# Patient Record
Sex: Male | Born: 1940 | Race: Black or African American | Hispanic: No | Marital: Single | Smoking: Current every day smoker
Health system: Southern US, Community
[De-identification: ages and names within clinical notes are randomized; demographics above are authoritative.]

## PROBLEM LIST (undated history)

## (undated) DIAGNOSIS — K219 Gastro-esophageal reflux disease without esophagitis: Secondary | ICD-10-CM

## (undated) DIAGNOSIS — Z8619 Personal history of other infectious and parasitic diseases: Secondary | ICD-10-CM

## (undated) DIAGNOSIS — R972 Elevated prostate specific antigen [PSA]: Secondary | ICD-10-CM

## (undated) DIAGNOSIS — I10 Essential (primary) hypertension: Secondary | ICD-10-CM

## (undated) DIAGNOSIS — F4321 Adjustment disorder with depressed mood: Secondary | ICD-10-CM

## (undated) DIAGNOSIS — C679 Malignant neoplasm of bladder, unspecified: Secondary | ICD-10-CM

## (undated) DIAGNOSIS — E119 Type 2 diabetes mellitus without complications: Secondary | ICD-10-CM

## (undated) DIAGNOSIS — F329 Major depressive disorder, single episode, unspecified: Secondary | ICD-10-CM

## (undated) DIAGNOSIS — F17200 Nicotine dependence, unspecified, uncomplicated: Secondary | ICD-10-CM

## (undated) DIAGNOSIS — E785 Hyperlipidemia, unspecified: Secondary | ICD-10-CM

## (undated) DIAGNOSIS — M549 Dorsalgia, unspecified: Secondary | ICD-10-CM

## (undated) HISTORY — DX: Hyperlipidemia, unspecified: E78.5

## (undated) HISTORY — DX: Dorsalgia, unspecified: M54.9

## (undated) HISTORY — DX: Malignant neoplasm of bladder, unspecified: C67.9

## (undated) HISTORY — DX: Major depressive disorder, single episode, unspecified: F32.9

## (undated) HISTORY — DX: Essential (primary) hypertension: I10

## (undated) HISTORY — DX: Adjustment disorder with depressed mood: F43.21

## (undated) HISTORY — DX: Personal history of other infectious and parasitic diseases: Z86.19

## (undated) HISTORY — DX: Gastro-esophageal reflux disease without esophagitis: K21.9

## (undated) HISTORY — DX: Nicotine dependence, unspecified, uncomplicated: F17.200

## (undated) HISTORY — DX: Elevated prostate specific antigen (PSA): R97.20

## (undated) HISTORY — PX: OTHER SURGICAL HISTORY: SHX169

## (undated) HISTORY — DX: Type 2 diabetes mellitus without complications: E11.9

---

## 1998-09-15 ENCOUNTER — Encounter: Admission: RE | Admit: 1998-09-15 | Discharge: 1998-09-15 | Payer: Self-pay | Admitting: *Deleted

## 1999-06-08 ENCOUNTER — Encounter: Payer: Self-pay | Admitting: Otolaryngology

## 1999-06-08 ENCOUNTER — Ambulatory Visit (HOSPITAL_COMMUNITY): Admission: RE | Admit: 1999-06-08 | Discharge: 1999-06-08 | Payer: Self-pay | Admitting: Otolaryngology

## 1999-06-11 ENCOUNTER — Ambulatory Visit (HOSPITAL_BASED_OUTPATIENT_CLINIC_OR_DEPARTMENT_OTHER): Admission: RE | Admit: 1999-06-11 | Discharge: 1999-06-11 | Payer: Self-pay | Admitting: Otolaryngology

## 2001-06-05 ENCOUNTER — Ambulatory Visit (HOSPITAL_COMMUNITY): Admission: RE | Admit: 2001-06-05 | Discharge: 2001-06-05 | Payer: Self-pay | Admitting: Internal Medicine

## 2001-06-05 ENCOUNTER — Encounter: Payer: Self-pay | Admitting: Internal Medicine

## 2004-05-10 ENCOUNTER — Ambulatory Visit: Admission: RE | Admit: 2004-05-10 | Discharge: 2004-05-10 | Payer: Self-pay | Admitting: Internal Medicine

## 2004-07-09 ENCOUNTER — Ambulatory Visit: Payer: Self-pay | Admitting: Internal Medicine

## 2004-07-24 ENCOUNTER — Ambulatory Visit: Payer: Self-pay | Admitting: Internal Medicine

## 2004-08-10 ENCOUNTER — Ambulatory Visit: Payer: Self-pay | Admitting: Internal Medicine

## 2004-08-13 ENCOUNTER — Ambulatory Visit: Payer: Self-pay | Admitting: Internal Medicine

## 2004-08-16 ENCOUNTER — Ambulatory Visit: Payer: Self-pay | Admitting: Internal Medicine

## 2004-08-21 ENCOUNTER — Ambulatory Visit: Payer: Self-pay | Admitting: Internal Medicine

## 2004-08-29 ENCOUNTER — Ambulatory Visit: Payer: Self-pay | Admitting: Family Medicine

## 2004-09-05 ENCOUNTER — Ambulatory Visit: Payer: Self-pay | Admitting: Internal Medicine

## 2004-09-07 ENCOUNTER — Ambulatory Visit: Payer: Self-pay | Admitting: Internal Medicine

## 2004-09-12 ENCOUNTER — Ambulatory Visit: Payer: Self-pay | Admitting: Internal Medicine

## 2004-09-26 ENCOUNTER — Ambulatory Visit: Payer: Self-pay | Admitting: Internal Medicine

## 2004-10-18 ENCOUNTER — Ambulatory Visit: Payer: Self-pay | Admitting: Internal Medicine

## 2004-11-15 ENCOUNTER — Ambulatory Visit: Payer: Self-pay | Admitting: Internal Medicine

## 2005-03-01 ENCOUNTER — Ambulatory Visit: Payer: Self-pay | Admitting: Internal Medicine

## 2005-03-13 ENCOUNTER — Ambulatory Visit: Payer: Self-pay | Admitting: Internal Medicine

## 2005-03-19 ENCOUNTER — Ambulatory Visit: Payer: Self-pay | Admitting: Internal Medicine

## 2005-04-30 ENCOUNTER — Ambulatory Visit: Payer: Self-pay | Admitting: Internal Medicine

## 2005-05-01 ENCOUNTER — Inpatient Hospital Stay (HOSPITAL_COMMUNITY): Admission: EM | Admit: 2005-05-01 | Discharge: 2005-05-04 | Payer: Self-pay | Admitting: Emergency Medicine

## 2005-05-01 ENCOUNTER — Ambulatory Visit: Payer: Self-pay | Admitting: Gastroenterology

## 2005-05-01 ENCOUNTER — Encounter (INDEPENDENT_AMBULATORY_CARE_PROVIDER_SITE_OTHER): Payer: Self-pay | Admitting: Specialist

## 2005-05-02 ENCOUNTER — Ambulatory Visit: Payer: Self-pay | Admitting: Gastroenterology

## 2005-05-08 ENCOUNTER — Ambulatory Visit: Payer: Self-pay | Admitting: Internal Medicine

## 2005-05-18 ENCOUNTER — Observation Stay (HOSPITAL_COMMUNITY): Admission: EM | Admit: 2005-05-18 | Discharge: 2005-05-19 | Payer: Self-pay | Admitting: Emergency Medicine

## 2005-05-29 ENCOUNTER — Ambulatory Visit: Payer: Self-pay | Admitting: Gastroenterology

## 2005-06-05 ENCOUNTER — Encounter (INDEPENDENT_AMBULATORY_CARE_PROVIDER_SITE_OTHER): Payer: Self-pay | Admitting: Specialist

## 2005-06-05 ENCOUNTER — Ambulatory Visit: Payer: Self-pay | Admitting: Gastroenterology

## 2005-06-10 ENCOUNTER — Ambulatory Visit: Payer: Self-pay | Admitting: Internal Medicine

## 2005-07-04 ENCOUNTER — Ambulatory Visit: Payer: Self-pay | Admitting: Gastroenterology

## 2005-08-19 ENCOUNTER — Ambulatory Visit: Payer: Self-pay | Admitting: Internal Medicine

## 2005-09-16 ENCOUNTER — Ambulatory Visit: Payer: Self-pay | Admitting: Internal Medicine

## 2005-12-16 ENCOUNTER — Ambulatory Visit: Payer: Self-pay | Admitting: Internal Medicine

## 2006-01-01 ENCOUNTER — Ambulatory Visit: Payer: Self-pay | Admitting: Internal Medicine

## 2006-03-17 ENCOUNTER — Ambulatory Visit: Payer: Self-pay | Admitting: Internal Medicine

## 2006-06-03 ENCOUNTER — Ambulatory Visit: Payer: Self-pay | Admitting: Internal Medicine

## 2006-07-14 ENCOUNTER — Ambulatory Visit: Payer: Self-pay | Admitting: Internal Medicine

## 2006-08-28 ENCOUNTER — Ambulatory Visit: Payer: Self-pay | Admitting: Internal Medicine

## 2006-10-13 ENCOUNTER — Ambulatory Visit: Payer: Self-pay | Admitting: Internal Medicine

## 2006-10-13 LAB — CONVERTED CEMR LAB
Basophils Absolute: 0 10*3/uL (ref 0.0–0.1)
Basophils Relative: 0.6 % (ref 0.0–1.0)
Eosinophils Absolute: 0.9 10*3/uL — ABNORMAL HIGH (ref 0.0–0.6)
Eosinophils Relative: 12.9 % — ABNORMAL HIGH (ref 0.0–5.0)
HCT: 43.8 % (ref 39.0–52.0)
Hemoglobin: 15.1 g/dL (ref 13.0–17.0)
Iron: 82 ug/dL (ref 42–165)
Lymphocytes Relative: 34.8 % (ref 12.0–46.0)
MCHC: 35.2 g/dL (ref 30.0–36.0)
MCV: 97.8 fL (ref 78.0–100.0)
Monocytes Absolute: 0.7 10*3/uL (ref 0.2–0.7)
Monocytes Relative: 9.4 % (ref 3.0–11.0)
Neutro Abs: 3.2 10*3/uL (ref 1.4–7.7)
Neutrophils Relative %: 42.3 % — ABNORMAL LOW (ref 43.0–77.0)
Platelets: 164 10*3/uL (ref 150–400)
RBC: 4.48 M/uL (ref 4.22–5.81)
RDW: 14.4 % (ref 11.5–14.6)
Saturation Ratios: 30.3 % (ref 20.0–50.0)
Transferrin: 193.2 mg/dL — ABNORMAL LOW (ref >=212.0)
WBC: 7.3 10*3/uL (ref 4.5–10.5)

## 2007-01-27 ENCOUNTER — Emergency Department (HOSPITAL_COMMUNITY): Admission: EM | Admit: 2007-01-27 | Discharge: 2007-01-27 | Payer: Self-pay | Admitting: Emergency Medicine

## 2007-02-03 ENCOUNTER — Ambulatory Visit: Payer: Self-pay | Admitting: Internal Medicine

## 2007-02-03 LAB — CONVERTED CEMR LAB
BUN: 17 mg/dL (ref 6–23)
CO2: 27 meq/L (ref 19–32)
Calcium: 9.2 mg/dL (ref 8.4–10.5)
Chloride: 107 meq/L (ref 96–112)
Creatinine, Ser: 1.1 mg/dL (ref 0.4–1.5)
GFR calc Af Amer: 86 mL/min
GFR calc non Af Amer: 71 mL/min
Glucose, Bld: 98 mg/dL (ref 70–99)
Hgb A1c MFr Bld: 6.6 % — ABNORMAL HIGH (ref 4.6–6.0)
PSA: 0.15 ng/mL (ref 0.10–4.00)
Potassium: 4.3 meq/L (ref 3.5–5.1)
Sodium: 142 meq/L (ref 135–145)

## 2007-02-16 ENCOUNTER — Observation Stay (HOSPITAL_COMMUNITY): Admission: AD | Admit: 2007-02-16 | Discharge: 2007-02-17 | Payer: Self-pay | Admitting: Urology

## 2007-02-16 ENCOUNTER — Encounter: Payer: Self-pay | Admitting: Urology

## 2007-02-16 ENCOUNTER — Encounter: Payer: Self-pay | Admitting: Internal Medicine

## 2007-03-03 ENCOUNTER — Encounter: Payer: Self-pay | Admitting: Internal Medicine

## 2007-03-05 ENCOUNTER — Ambulatory Visit: Payer: Self-pay | Admitting: Internal Medicine

## 2007-03-23 ENCOUNTER — Ambulatory Visit (HOSPITAL_COMMUNITY): Admission: AD | Admit: 2007-03-23 | Discharge: 2007-03-24 | Payer: Self-pay | Admitting: Urology

## 2007-03-23 ENCOUNTER — Encounter: Payer: Self-pay | Admitting: Urology

## 2007-03-23 ENCOUNTER — Other Ambulatory Visit: Payer: Self-pay | Admitting: Urology

## 2007-04-07 ENCOUNTER — Encounter: Payer: Self-pay | Admitting: Internal Medicine

## 2007-06-01 DIAGNOSIS — I10 Essential (primary) hypertension: Secondary | ICD-10-CM | POA: Insufficient documentation

## 2007-06-01 DIAGNOSIS — Z8619 Personal history of other infectious and parasitic diseases: Secondary | ICD-10-CM

## 2007-06-01 DIAGNOSIS — E119 Type 2 diabetes mellitus without complications: Secondary | ICD-10-CM

## 2007-06-01 HISTORY — DX: Type 2 diabetes mellitus without complications: E11.9

## 2007-06-01 HISTORY — DX: Personal history of other infectious and parasitic diseases: Z86.19

## 2007-06-01 HISTORY — DX: Essential (primary) hypertension: I10

## 2007-06-25 ENCOUNTER — Ambulatory Visit: Payer: Self-pay | Admitting: Internal Medicine

## 2007-06-25 DIAGNOSIS — F4321 Adjustment disorder with depressed mood: Secondary | ICD-10-CM

## 2007-06-25 HISTORY — DX: Adjustment disorder with depressed mood: F43.21

## 2007-08-21 ENCOUNTER — Ambulatory Visit: Payer: Self-pay | Admitting: Internal Medicine

## 2007-08-21 LAB — CONVERTED CEMR LAB
BUN: 13 mg/dL (ref 6–23)
CO2: 29 meq/L (ref 19–32)
Calcium: 9.5 mg/dL (ref 8.4–10.5)
Chloride: 104 meq/L (ref 96–112)
Creatinine, Ser: 1.1 mg/dL (ref 0.4–1.5)
GFR calc Af Amer: 86 mL/min
GFR calc non Af Amer: 71 mL/min
Glucose, Bld: 180 mg/dL — ABNORMAL HIGH (ref 70–99)
Hgb A1c MFr Bld: 6.1 % — ABNORMAL HIGH (ref 4.6–6.0)
Potassium: 4.1 meq/L (ref 3.5–5.1)
Sodium: 140 meq/L (ref 135–145)

## 2007-09-15 ENCOUNTER — Encounter: Payer: Self-pay | Admitting: Internal Medicine

## 2007-10-05 ENCOUNTER — Encounter: Payer: Self-pay | Admitting: Urology

## 2007-10-05 ENCOUNTER — Ambulatory Visit (HOSPITAL_BASED_OUTPATIENT_CLINIC_OR_DEPARTMENT_OTHER): Admission: RE | Admit: 2007-10-05 | Discharge: 2007-10-05 | Payer: Self-pay | Admitting: Urology

## 2007-10-13 ENCOUNTER — Encounter: Payer: Self-pay | Admitting: Internal Medicine

## 2007-10-16 ENCOUNTER — Telehealth (INDEPENDENT_AMBULATORY_CARE_PROVIDER_SITE_OTHER): Payer: Self-pay | Admitting: *Deleted

## 2007-11-24 ENCOUNTER — Ambulatory Visit: Payer: Self-pay | Admitting: Internal Medicine

## 2007-11-24 DIAGNOSIS — F329 Major depressive disorder, single episode, unspecified: Secondary | ICD-10-CM

## 2007-11-24 DIAGNOSIS — M549 Dorsalgia, unspecified: Secondary | ICD-10-CM

## 2007-11-24 DIAGNOSIS — F3289 Other specified depressive episodes: Secondary | ICD-10-CM

## 2007-11-24 HISTORY — DX: Major depressive disorder, single episode, unspecified: F32.9

## 2007-11-24 HISTORY — DX: Other specified depressive episodes: F32.89

## 2007-11-24 HISTORY — DX: Dorsalgia, unspecified: M54.9

## 2007-11-24 LAB — CONVERTED CEMR LAB
BUN: 12 mg/dL (ref 6–23)
CO2: 29 meq/L (ref 19–32)
Calcium: 9.8 mg/dL (ref 8.4–10.5)
Chloride: 104 meq/L (ref 96–112)
Creatinine, Ser: 1 mg/dL (ref 0.4–1.5)
Creatinine,U: 116.3 mg/dL
GFR calc Af Amer: 96 mL/min
GFR calc non Af Amer: 79 mL/min
Glucose, Bld: 117 mg/dL — ABNORMAL HIGH (ref 70–99)
Hgb A1c MFr Bld: 7.3 % — ABNORMAL HIGH (ref 4.6–6.0)
Microalb Creat Ratio: 37 mg/g — ABNORMAL HIGH (ref 0.0–30.0)
Microalb, Ur: 4.3 mg/dL — ABNORMAL HIGH (ref 0.0–1.9)
Potassium: 4.8 meq/L (ref 3.5–5.1)
Sodium: 139 meq/L (ref 135–145)

## 2008-02-24 ENCOUNTER — Ambulatory Visit: Payer: Self-pay | Admitting: Internal Medicine

## 2008-04-20 ENCOUNTER — Ambulatory Visit: Payer: Self-pay | Admitting: Internal Medicine

## 2008-04-20 DIAGNOSIS — K219 Gastro-esophageal reflux disease without esophagitis: Secondary | ICD-10-CM

## 2008-04-20 HISTORY — DX: Gastro-esophageal reflux disease without esophagitis: K21.9

## 2008-06-28 ENCOUNTER — Encounter: Payer: Self-pay | Admitting: Internal Medicine

## 2008-07-22 ENCOUNTER — Ambulatory Visit: Payer: Self-pay | Admitting: Internal Medicine

## 2008-10-14 ENCOUNTER — Ambulatory Visit: Payer: Self-pay | Admitting: Internal Medicine

## 2008-10-14 LAB — CONVERTED CEMR LAB
BUN: 7 mg/dL (ref 6–23)
CO2: 29 meq/L (ref 19–32)
Calcium: 9.7 mg/dL (ref 8.4–10.5)
Chloride: 105 meq/L (ref 96–112)
Cholesterol: 173 mg/dL (ref 0–200)
Creatinine, Ser: 1 mg/dL (ref 0.4–1.5)
GFR calc Af Amer: 96 mL/min
GFR calc non Af Amer: 79 mL/min
Glucose, Bld: 122 mg/dL — ABNORMAL HIGH (ref 70–99)
HDL: 40.4 mg/dL (ref >39.0)
Hgb A1c MFr Bld: 6.2 % — ABNORMAL HIGH (ref 4.6–6.0)
LDL Cholesterol: 116 mg/dL — ABNORMAL HIGH (ref 0–99)
Potassium: 4.6 meq/L (ref 3.5–5.1)
Sodium: 140 meq/L (ref 135–145)
Total CHOL/HDL Ratio: 4.3
Triglycerides: 82 mg/dL (ref 0–149)
VLDL: 16 mg/dL (ref 0–40)

## 2008-10-21 ENCOUNTER — Ambulatory Visit: Payer: Self-pay | Admitting: Internal Medicine

## 2008-12-23 ENCOUNTER — Ambulatory Visit: Payer: Self-pay | Admitting: Internal Medicine

## 2008-12-23 DIAGNOSIS — F17200 Nicotine dependence, unspecified, uncomplicated: Secondary | ICD-10-CM

## 2008-12-23 HISTORY — DX: Nicotine dependence, unspecified, uncomplicated: F17.200

## 2009-03-20 ENCOUNTER — Ambulatory Visit: Payer: Self-pay | Admitting: Internal Medicine

## 2009-03-20 DIAGNOSIS — E785 Hyperlipidemia, unspecified: Secondary | ICD-10-CM

## 2009-03-20 DIAGNOSIS — R972 Elevated prostate specific antigen [PSA]: Secondary | ICD-10-CM

## 2009-03-20 DIAGNOSIS — C679 Malignant neoplasm of bladder, unspecified: Secondary | ICD-10-CM | POA: Insufficient documentation

## 2009-03-20 HISTORY — DX: Elevated prostate specific antigen (PSA): R97.20

## 2009-03-20 HISTORY — DX: Malignant neoplasm of bladder, unspecified: C67.9

## 2009-03-20 HISTORY — DX: Hyperlipidemia, unspecified: E78.5

## 2009-03-20 LAB — CONVERTED CEMR LAB
ALT: 15 units/L (ref 0–53)
AST: 18 units/L (ref 0–37)
Albumin: 3.9 g/dL (ref 3.5–5.2)
Alkaline Phosphatase: 77 units/L (ref 39–117)
BUN: 16 mg/dL (ref 6–23)
Basophils Relative: 0.8 % (ref 0.0–3.0)
Bilirubin, Direct: 0 mg/dL (ref 0.0–0.3)
CO2: 30 meq/L (ref 19–32)
Calcium: 9.7 mg/dL (ref 8.4–10.5)
Chloride: 108 meq/L (ref 96–112)
Cholesterol: 164 mg/dL (ref 0–200)
Creatinine, Ser: 1 mg/dL (ref 0.4–1.5)
Creatinine,U: 162.8 mg/dL
Direct LDL: 101.2 mg/dL
Eosinophils Relative: 11.4 % — ABNORMAL HIGH (ref 0.0–5.0)
GFR calc non Af Amer: 95.59 mL/min (ref >60)
Glucose, Bld: 120 mg/dL — ABNORMAL HIGH (ref 70–99)
HCT: 41.6 % (ref 39.0–52.0)
HDL: 44.8 mg/dL (ref >39.00)
Hemoglobin: 15 g/dL (ref 13.0–17.0)
Hgb A1c MFr Bld: 6.5 % (ref 4.6–6.5)
Lymphocytes Relative: 23.7 % (ref 12.0–46.0)
MCHC: 35.9 g/dL (ref 30.0–36.0)
MCV: 94.5 fL (ref 78.0–100.0)
Microalb Creat Ratio: 20.3 mg/g (ref 0.0–30.0)
Microalb, Ur: 3.3 mg/dL — ABNORMAL HIGH (ref 0.0–1.9)
Monocytes Relative: 6.5 % (ref 3.0–12.0)
Neutrophils Relative %: 57.6 % (ref 43.0–77.0)
PSA: 0.14 ng/mL (ref 0.10–4.00)
Platelets: 142 10*3/uL — ABNORMAL LOW (ref 150.0–400.0)
Potassium: 4.4 meq/L (ref 3.5–5.1)
RBC: 4.4 M/uL (ref 4.22–5.81)
RDW: 14.2 % (ref 11.5–14.6)
Sodium: 143 meq/L (ref 135–145)
TSH: 0.3 microintl units/mL — ABNORMAL LOW (ref 0.35–5.50)
Total Bilirubin: 0.8 mg/dL (ref 0.3–1.2)
Total Protein: 7.5 g/dL (ref 6.0–8.3)
WBC: 6.9 10*3/uL (ref 4.5–10.5)

## 2009-03-20 LAB — HM DIABETES FOOT EXAM

## 2009-06-19 ENCOUNTER — Ambulatory Visit: Payer: Self-pay | Admitting: Internal Medicine

## 2009-06-19 LAB — CONVERTED CEMR LAB
BUN: 16 mg/dL (ref 6–23)
CO2: 30 meq/L (ref 19–32)
Chloride: 105 meq/L (ref 96–112)
Creatinine, Ser: 1 mg/dL (ref 0.4–1.5)
Hgb A1c MFr Bld: 6.5 % (ref 4.6–6.5)
Potassium: 4.4 meq/L (ref 3.5–5.1)

## 2009-09-21 ENCOUNTER — Telehealth: Payer: Self-pay | Admitting: Internal Medicine

## 2009-12-18 ENCOUNTER — Ambulatory Visit: Payer: Self-pay | Admitting: Internal Medicine

## 2009-12-18 LAB — CONVERTED CEMR LAB
ALT: 16 units/L (ref 0–53)
AST: 17 units/L (ref 0–37)
Albumin: 4 g/dL (ref 3.5–5.2)
Calcium: 9.6 mg/dL (ref 8.4–10.5)
Eosinophils Relative: 13.4 % — ABNORMAL HIGH (ref 0.0–5.0)
GFR calc non Af Amer: 95.37 mL/min (ref >60)
HCT: 43.1 % (ref 39.0–52.0)
Hemoglobin: 14.9 g/dL (ref 13.0–17.0)
Hgb A1c MFr Bld: 6.3 % (ref 4.6–6.5)
Lymphs Abs: 2 10*3/uL (ref 0.7–4.0)
MCV: 97.3 fL (ref 78.0–100.0)
Monocytes Absolute: 0.6 10*3/uL (ref 0.1–1.0)
Monocytes Relative: 7.4 % (ref 3.0–12.0)
Neutro Abs: 4 10*3/uL (ref 1.4–7.7)
Platelets: 205 10*3/uL (ref 150.0–400.0)
Potassium: 4.2 meq/L (ref 3.5–5.1)
Sodium: 141 meq/L (ref 135–145)
Total Bilirubin: 0.5 mg/dL (ref 0.3–1.2)
Total Protein: 7.6 g/dL (ref 6.0–8.3)
WBC: 7.6 10*3/uL (ref 4.5–10.5)

## 2010-03-20 ENCOUNTER — Ambulatory Visit: Payer: Self-pay | Admitting: Internal Medicine

## 2010-09-11 ENCOUNTER — Ambulatory Visit: Admit: 2010-09-11 | Payer: Self-pay | Admitting: Internal Medicine

## 2010-09-21 ENCOUNTER — Ambulatory Visit: Admit: 2010-09-21 | Payer: Self-pay | Admitting: Internal Medicine

## 2010-10-04 NOTE — Assessment & Plan Note (Signed)
Summary: Follow up/ns/cb   Vital Signs:  Patient profile:   70 year old male Height:      68 inches Weight:      186 pounds BMI:     28.38 Temp:     98.2 degrees F oral Pulse rate:   72 / minute Resp:     14 per minute BP sitting:   136 / 80  (left arm)  Vitals Entered By: Willy Eddy, LPN (March 20, 2010 11:12 AM) CC: roa- out of lexapro and has taken in several months-states he doesnt see a difference when takingt, Hypertension Management   CC:  roa- out of lexapro and has taken in several months-states he doesnt see a difference when takingt and Hypertension Management.  History of Present Illness: follow up DM and the labs drawn kas OV were reviewed with this pt  he states that he cannot quit smoking  has felt well except tired has not been exercize   Hypertension History:      He denies headache, chest pain, palpitations, dyspnea with exertion, orthopnea, PND, peripheral edema, visual symptoms, neurologic problems, syncope, and side effects from treatment.  no chest pain.        Positive major cardiovascular risk factors include male age 86 years old or older, diabetes, hyperlipidemia, hypertension, and current tobacco user.        Further assessment for target organ damage reveals no history of ASHD, cardiac end-organ damage (CHF/LVH), stroke/TIA, peripheral vascular disease, renal insufficiency, or hypertensive retinopathy.     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 Once Daily 2)  Prilosec 40 Mg  Cpdr (Omeprazole) .... Once Daily 3)  Multivitamins  Caps (Multiple Vitamin) .Marland Kitchen.. 1 Once Daily 4)  Amaryl 2 Mg  Tabs (Glimepiride) .... One By Mouth Daily 5)  Ranitidine Hcl 300 Mg  Caps (Ranitidine Hcl) .... One By Mouth Daily For Ulcer Protection 6)  Lexapro 10 Mg Tabs (Escitalopram Oxalate) .... One By Mouth Daily 7)  Cvs Vitamin B-6 100 Mg Tabs (Pyridoxine Hcl)  Current Medications (verified): 1)  Bisoprolol-Hydrochlorothiazide 10-6.25 Mg  Tabs (Bisoprolol-Hydrochlorothiazide) .... Once Daily 2)  Prilosec 40 Mg  Cpdr (Omeprazole) .... Once Daily 3)  Multivitamins  Caps (Multiple Vitamin) .Marland Kitchen.. 1 Once Daily 4)  Amaryl 2 Mg  Tabs (Glimepiride) .... One By Mouth Daily 5)  Ranitidine Hcl 300 Mg  Caps (Ranitidine Hcl) .... One By Mouth Daily For Ulcer Protection 6)  Lexapro 10 Mg Tabs (Escitalopram Oxalate) .... Not Taking-1 Once Daily 7)  Cvs Vitamin B-6 100 Mg Tabs (Pyridoxine Hcl)  Allergies: No Known Drug Allergies  Past History:  Family History: Last updated: 06/01/2007 Family History of Arthritis Family  History Diabetes 1st degree relative Family History Hypertension Family History of Ulcers Family History of Digestive disorder  Social History: Last updated: 06/01/2007 Occupation: Single Current Smoker Alcohol use-yes  Risk Factors: Exercise: yes (06/25/2007)  Risk Factors: Smoking Status: current (03/20/2010) Packs/Day: 0.5 (03/20/2010) Passive Smoke Exposure: no (03/20/2010)  Past medical, surgical, family and social histories (including risk factors) reviewed, and no changes noted (except as noted below).  Past Medical  History: Reviewed history from 06/01/2007 and no changes required. DJD L Shoulder Diabetes mellitus, type II Hypertension Hepatitis B, hx of  Past Surgical History: Reviewed history from 06/01/2007 and no changes required. Ankle surgery  Family History: Reviewed history from 06/01/2007 and no changes required. Family History of Arthritis Family History Diabetes 1st degree relative Family History Hypertension Family History of Ulcers Family History of Digestive disorder  Social History: Reviewed history from 06/01/2007 and no changes required. Occupation: Single Current Smoker Alcohol use-yes  Review of Systems  The patient denies anorexia, fever, weight loss, weight gain, vision loss, decreased hearing, hoarseness, chest pain, syncope, dyspnea on exertion, peripheral edema, prolonged cough, headaches, hemoptysis, abdominal pain, melena, hematochezia, severe indigestion/heartburn, hematuria, incontinence, genital sores, muscle weakness, suspicious skin lesions, transient blindness, difficulty walking, depression, unusual weight change, abnormal bleeding, enlarged lymph nodes, angioedema, breast masses, and testicular masses.    Physical Exam  General:  Well-developed,well-nourished,in no acute distress; alert,appropriate and cooperative throughout examination Head:  normocephalic and atraumatic.   Eyes:  senile changes pupils equal and pupils round.   Ears:  R ear normal and L ear normal.   Nose:  no external deformity and no nasal discharge.   Neck:  supple and no masses.   Lungs:  normal respiratory effort and no wheezes.   Heart:  normal rate and regular rhythm.   Abdomen:  soft and non-tender.   Msk:  decreased ROM, joint tenderness, and lumbar lordosis.   Neurologic:  alert & oriented X3 and finger-to-nose normal.     Impression & Recommendations:  Problem # 1:  HYPERLIPIDEMIA, ATHEROGENIC (ICD-272.4)  stable  Labs Reviewed: SGOT: 17 (12/18/2009)   SGPT: 16  (12/18/2009)  Prior 10 Yr Risk Heart Disease: 47 % (06/19/2009)   HDL:44.80 (03/20/2009), 40.4 (10/14/2008)  LDL:116 (10/14/2008)  Chol:164 (03/20/2009), 173 (10/14/2008)  Trig:82 (10/14/2008)  Problem # 2:  TOBACCO USER (ICD-305.1)  Encouraged smoking cessation and discussed different methods for smoking cessation.  5 min  Orders: Tobacco use cessation intermediate 3-10 minutes (16109)  Problem # 3:  GERD (ICD-530.81)  His updated medication list for this problem includes:    Prilosec 40 Mg Cpdr (Omeprazole) ..... Once daily    Ranitidine Hcl 300 Mg Caps (Ranitidine hcl) ..... One by mouth daily for ulcer protection  Labs Reviewed: Hgb: 14.9 (12/18/2009)   Hct: 43.1 (12/18/2009)  Problem # 4:  DEPRESSION, CHRONIC (ICD-311)  His updated medication list for this problem includes:    Citalopram Hydrobromide 20 Mg Tabs (Citalopram hydrobromide) ..... One by mouth daily  Discussed treatment options, including trial of antidpressant medication. Will refer to behavioral health. Follow-up call in in 24-48 hours and recheck in 2 weeks, sooner as needed. Patient agrees to call if any worsening of symptoms or thoughts of doing harm arise. Verified that the patient has no suicidal ideation at this time.   Problem # 5:  DIABETES MELLITUS, TYPE II (ICD-250.00)  His updated medication list for this problem includes:    Amaryl 2 Mg Tabs (Glimepiride) ..... One by mouth daily  Labs Reviewed:  Creat: 1.0 (12/18/2009)    Reviewed HgBA1c results: 6.3 (12/18/2009)  6.5 (06/19/2009)  Complete Medication List: 1)  Bisoprolol-hydrochlorothiazide 10-6.25 Mg Tabs (Bisoprolol-hydrochlorothiazide) .... Once daily 2)  Prilosec 40 Mg Cpdr (Omeprazole) .... Once daily 3)  Multivitamins Caps (Multiple vitamin) .Marland Kitchen.. 1 once daily 4)  Amaryl 2 Mg Tabs (Glimepiride) .... One by mouth daily 5)  Ranitidine Hcl 300 Mg Caps (Ranitidine hcl) .... One by mouth daily for ulcer protection 6)  Citalopram  Hydrobromide 20 Mg Tabs (Citalopram hydrobromide) .... One by mouth daily 7)  Cvs Vitamin B-6 100 Mg Tabs (Pyridoxine hcl)  Hypertension Assessment/Plan:      The patient's hypertensive risk group is category C: Target organ damage and/or diabetes.  His calculated 10 year risk of coronary heart disease is 47 %.  Today's blood pressure is 136/80.  His blood pressure goal is < 130/80.  Patient Instructions: 1)  Please schedule a follow-up appointment in 4  months. Prescriptions: CITALOPRAM HYDROBROMIDE 20 MG TABS (CITALOPRAM HYDROBROMIDE) one by mouth daily  #30 x 11   Entered and Authorized by:   Stacie Glaze MD   Signed by:   Stacie Glaze MD on 03/20/2010   Method used:   Electronically to        [facility] (retail)       [address]       [place], [place]  [address]       Ph: [phone]       Fax: [phone]   RxID:   [id number] BISOPROLOL-HYDROCHLOROTHIAZIDE 10-6.25 MG  TABS (BISOPROLOL-HYDROCHLOROTHIAZIDE) once daily  #30 Tablet x 6   Entered and Authorized by:   Stacie Glaze MD   Signed by:   Stacie Glaze MD on 03/20/2010   Method used:   Electronically to        [facility] (retail)       [address]       [place], [place]  [address]       Ph: [phone]       Fax: [phone]   RxID:   [id number]

## 2010-10-04 NOTE — Letter (Signed)
Summary: [facility]  [facility]   Imported By: Maryln Gottron 02/14/2010 10:28:27  _____________________________________________________________________  External Attachment:    Type:   Image     Comment:   External Document

## 2010-10-04 NOTE — Progress Notes (Signed)
Summary: Lexapro  Phone Note Call from Patient   Caller: Patient Call For: Stacie Glaze MD Summary of Call: Pt needs Lexapro samples or refill to [facility]) [phone] Initial call taken by: Lynann Beaver CMA,  September 21, 2009 1:24 PM    Prescriptions: LEXAPRO 10 MG TABS (ESCITALOPRAM OXALATE) one by mouth daily  #30 x 6   Entered by:   Willy Eddy, LPN   Authorized by:   Stacie Glaze MD   Signed by:   Willy Eddy, LPN on 11/91/4782   Method used:   Electronically to        [facility] (retail)       [address]       [place], [place]  [address]       Ph: [phone]       Fax: [phone]   RxID:   [id number]   Appended Document: Lexapro pt informed

## 2010-10-04 NOTE — Assessment & Plan Note (Signed)
Summary: 6 month rov/njr   Vital Signs:  Patient profile:   70 year old male Height:      68 inches Weight:      196 pounds BMI:     29.91 Temp:     98.2 degrees F oral Pulse rate:   72 / minute Resp:     14 per minute BP sitting:   130 / 80  (left arm)  Vitals Entered By: Willy Eddy, LPN (December 18, 2009 10:48 AM) CC: roa- pt has been out of lexapro samples for 2 weeks and states he has never taken amaryl???states he doesnt take anything for diabetes Comments out of lexapro for 2 weeks and states he has never taken am aryl or glimerperide- states he doesnt take anything for diabetes   CC:  roa- pt has been out of lexapro samples for 2 weeks and states he has never taken amaryl???states he doesnt take anything for diabetes.  History of Present Illness: has been out of lexapro for two weeks and does not feel depressed he has not felt angery, he did complete over a year on the medications has foot pain in left foot with a hx of prior surgery and plate placement or screws ( he is not sure  Preventive Screening-Counseling & Management  Alcohol-Tobacco     Smoking Status: current     Smoking Cessation Counseling: yes     Smoke Cessation Stage: precontemplative     Packs/Day: 0.5     Passive Smoke Exposure: no  Problems Prior to Update: 1)  Psa, Increased  (ICD-790.93) 2)  Hx of Bladder Cancer  (ICD-188.9) 3)  Hyperlipidemia, 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 Once Daily 2)  Prilosec 40 Mg  Cpdr (Omeprazole)  .... Once Daily 3)  Multivitamins  Caps (Multiple Vitamin) .Marland Kitchen.. 1 Once Daily 4)  Amaryl 2 Mg  Tabs (Glimepiride) .... One By Mouth Daily 5)  Ranitidine Hcl 300 Mg  Caps (Ranitidine Hcl) .... One By Mouth Daily For Ulcer Protection 6)  Lexapro 10 Mg Tabs (Escitalopram Oxalate) .... One By Mouth Daily 7)  Cvs Vitamin B-6 100 Mg Tabs (Pyridoxine Hcl)  Current Medications (verified): 1)  Bisoprolol-Hydrochlorothiazide 10-6.25 Mg  Tabs (Bisoprolol-Hydrochlorothiazide) .... Once Daily 2)  Prilosec 40 Mg  Cpdr (Omeprazole) .... Once Daily 3)  Multivitamins  Caps (Multiple Vitamin) .Marland Kitchen.. 1 Once Daily 4)  Amaryl 2 Mg  Tabs (Glimepiride) .... One By Mouth Daily 5)  Ranitidine Hcl 300 Mg  Caps (Ranitidine Hcl) .... One By Mouth Daily For Ulcer Protection 6)  Lexapro 10 Mg Tabs (Escitalopram Oxalate) .... One By Mouth Daily 7)  Cvs Vitamin B-6 100 Mg Tabs (Pyridoxine Hcl)  Allergies (verified): No Known Drug Allergies  Past History:  Family History: Last updated: 06/01/2007 Family History of Arthritis Family History Diabetes 1st degree relative Family History Hypertension Family History of Ulcers Family History of Digestive disorder  Social History: Last updated: 06/01/2007 Occupation: Single Current Smoker Alcohol use-yes  Risk Factors: Exercise: yes (06/25/2007)  Risk Factors: Smoking Status: current (12/18/2009) Packs/Day: 0.5 (12/18/2009)  Passive Smoke Exposure: no (12/18/2009)  Past medical, surgical, family and social histories (including risk factors) reviewed, and no changes noted (except as noted below).  Past Medical History: Reviewed history from 06/01/2007 and no changes required. DJD L Shoulder Diabetes mellitus, type II Hypertension Hepatitis B, hx of  Past Surgical History: Reviewed history from 06/01/2007 and no changes required. Ankle surgery  Family History: Reviewed history from 06/01/2007 and no changes required. Family History of Arthritis Family  History Diabetes 1st degree relative Family History Hypertension Family History of Ulcers Family History of Digestive disorder  Social History: Reviewed history from 06/01/2007 and no changes required. Occupation: Single Current Smoker Alcohol use-yes  Review of Systems  The patient denies anorexia, fever, weight loss, weight gain, vision loss, decreased hearing, hoarseness, chest pain, syncope, dyspnea on exertion, peripheral edema, prolonged cough, headaches, hemoptysis, abdominal pain, melena, hematochezia, severe indigestion/heartburn, hematuria, incontinence, genital sores, muscle weakness, suspicious skin lesions, transient blindness, difficulty walking, depression, unusual weight change, abnormal bleeding, enlarged lymph nodes, angioedema, and breast masses.    Physical Exam  General:  Well-developed,well-nourished,in no acute distress; alert,appropriate and cooperative throughout examination Head:  normocephalic and atraumatic.   Ears:  R ear normal and L ear normal.   Nose:  no external deformity and no nasal discharge.   Neck:  supple and no masses.   Lungs:  normal respiratory effort and no wheezes.   Heart:  normal rate and regular rhythm.   Abdomen:  soft and non-tender.   Extremities:  1+ left pedal edema and trace right pedal edema.   Neurologic:  alert & oriented X3 and finger-to-nose normal.     Impression & Recommendations:  Problem # 1:  DIABETES MELLITUS, TYPE II (ICD-250.00)  stopped meds and the pt is confusede if he has take this for over 3 month His updated medication list for this problem includes:    Amaryl 2 Mg Tabs (Glimepiride) ..... One by mouth daily  Labs Reviewed: Creat: 1.0 (06/19/2009)    Reviewed HgBA1c results: 6.5 (06/19/2009)  6.5 (03/20/2009)  Orders: TLB-A1C / Hgb A1C (Glycohemoglobin) (83036-A1C)  Problem # 2:  GERD (ICD-530.81)  His updated medication list for this problem includes:    Prilosec 40 Mg Cpdr (Omeprazole) .....  Once daily    Ranitidine Hcl 300 Mg Caps (Ranitidine hcl) ..... One by mouth daily for ulcer protection  Labs Reviewed: Hgb: 15.0 (03/20/2009)   Hct: 41.6 (03/20/2009)  Orders: TLB-CBC Platelet - w/Differential (85025-CBCD) Venipuncture (16109)  Problem # 3:  HYPERTENSION (ICD-401.9)  stable His updated medication list for this problem includes:    Bisoprolol-hydrochlorothiazide 10-6.25 Mg Tabs (Bisoprolol-hydrochlorothiazide) ..... Once daily  BP today: 130/80 Prior BP: 130/76 (06/19/2009)  Prior 10 Yr Risk Heart Disease: 47 % (06/19/2009)  Labs Reviewed: K+: 4.4 (06/19/2009) Creat: : 1.0 (06/19/2009)   Chol: 164 (03/20/2009)   HDL: 44.80 (03/20/2009)   LDL: 116 (10/14/2008)   TG: 82 (10/14/2008)  Orders: TLB-BMP (Basic Metabolic Panel-BMET) (80048-METABOL)  Complete Medication List: 1)  Bisoprolol-hydrochlorothiazide 10-6.25 Mg Tabs (Bisoprolol-hydrochlorothiazide) .... Once daily 2)  Prilosec 40 Mg Cpdr (Omeprazole) .... Once daily 3)  Multivitamins Caps (Multiple vitamin) .Marland Kitchen.. 1 once daily 4)  Amaryl 2 Mg Tabs (Glimepiride) .... One by mouth daily 5)  Ranitidine Hcl 300 Mg Caps (Ranitidine hcl) .... One by mouth daily for ulcer protection 6)  Lexapro 10 Mg Tabs (Escitalopram oxalate) .... One by mouth daily 7)  Cvs Vitamin B-6 100 Mg Tabs (Pyridoxine hcl)  Other Orders: TLB-Hepatic/Liver Function Pnl (  80076-HEPATIC)  Patient Instructions: 1)  Please schedule a follow-up appointment in 3 months.

## 2010-10-17 ENCOUNTER — Ambulatory Visit: Payer: Self-pay | Admitting: Internal Medicine

## 2010-10-18 ENCOUNTER — Encounter: Payer: Self-pay | Admitting: Internal Medicine

## 2010-10-18 ENCOUNTER — Telehealth: Payer: Self-pay | Admitting: Internal Medicine

## 2010-10-18 NOTE — Telephone Encounter (Signed)
[employer] rep  Dois Davenport) called to verify paperwork was received and processed / completed.Marland Kitchen Ref # [id number]...Marland KitchenMarland KitchenMarland Kitchen #  [phone].

## 2010-10-18 NOTE — Telephone Encounter (Signed)
We do not order hoover arounds- if a motorized wheelchair is needed he needs to come and see dr Lovell Sheehan for an evaluation-- I have tried to call p and all his numbers have been disconnected-if hoover around calls--we do not order those!  We have not see the pt in 6 months and has no showed for the last several visits

## 2010-10-19 ENCOUNTER — Encounter: Payer: Self-pay | Admitting: Internal Medicine

## 2010-10-19 ENCOUNTER — Ambulatory Visit (INDEPENDENT_AMBULATORY_CARE_PROVIDER_SITE_OTHER): Payer: Medicare Other | Admitting: Internal Medicine

## 2010-10-19 DIAGNOSIS — I10 Essential (primary) hypertension: Secondary | ICD-10-CM

## 2010-10-19 DIAGNOSIS — E119 Type 2 diabetes mellitus without complications: Secondary | ICD-10-CM

## 2010-10-19 DIAGNOSIS — E785 Hyperlipidemia, unspecified: Secondary | ICD-10-CM

## 2010-10-19 DIAGNOSIS — F329 Major depressive disorder, single episode, unspecified: Secondary | ICD-10-CM

## 2010-10-19 DIAGNOSIS — F3289 Other specified depressive episodes: Secondary | ICD-10-CM

## 2010-10-19 LAB — CBC WITH DIFFERENTIAL/PLATELET
Basophils Absolute: 0 10*3/uL (ref 0.0–0.1)
Basophils Relative: 0.3 % (ref 0.0–3.0)
Eosinophils Absolute: 0.6 10*3/uL (ref 0.0–0.7)
Lymphocytes Relative: 25 % (ref 12.0–46.0)
MCHC: 34.9 g/dL (ref 30.0–36.0)
Neutrophils Relative %: 59 % (ref 43.0–77.0)
RBC: 4.52 Mil/uL (ref 4.22–5.81)
WBC: 7.1 10*3/uL (ref 4.5–10.5)

## 2010-10-19 LAB — BASIC METABOLIC PANEL
Calcium: 9.4 mg/dL (ref 8.4–10.5)
GFR: 84.35 mL/min (ref >60.00)
Glucose, Bld: 88 mg/dL (ref 70–99)
Sodium: 141 mEq/L (ref 135–145)

## 2010-10-19 MED ORDER — GLIMEPIRIDE 2 MG PO TABS: 2.0000 mg | ORAL_TABLET | Freq: Every day | ORAL | Status: DC

## 2010-10-19 MED ORDER — CITALOPRAM HYDROBROMIDE 20 MG PO TABS: 20.0000 mg | ORAL_TABLET | Freq: Every day | ORAL | Status: DC

## 2010-10-19 MED ORDER — RANITIDINE HCL 300 MG PO CAPS: 300.0000 mg | ORAL_CAPSULE | Freq: Every evening | ORAL | Status: AC

## 2010-10-19 MED ORDER — BISOPROLOL-HYDROCHLOROTHIAZIDE 10-6.25 MG PO TABS: 1.0000 | ORAL_TABLET | Freq: Every day | ORAL | Status: DC

## 2010-10-19 NOTE — Progress Notes (Signed)
  Subjective:    Patient ID: Micheal Conner, male    DOB: Oct 31, 1940, 70 y.o.   MRN: [id number]  HPI    Review of Systems     Objective:   Physical Exam        Assessment & Plan:

## 2010-10-29 NOTE — Progress Notes (Signed)
Subjective:    Patient ID: Micheal Conner, male    DOB: 1941/02/09, 70 y.o.   MRN: [id number]  HPI  Micheal Conner is a 70 year old African American male who presents for followup of his chronic medical problems including diabetes hypertension and A. Chronic foot ulcer on the bottom of his right foot.   his son states that he has not been walking lately has been requesting an electric scooter because the bottom of his foot has been painful.   in reality the site of the bottom of the foot represents a plantars wart and the callus formation has caused a great deal of pain to his foot overcoming some of his moderate diabetic neuropathy.   his blood pressure is poorly controlled due to noncompliance with his medication he states that he did not pick up his pills this week and his blood sugar is out of control due to dietary noncompliance.    the patient's son relates that he lives alone in his home he lives upstairs she has a relative that lives downstairs the family lives in [place] and they are fearful that he is not eating appropriately not taking his medications appropriately and since he does not have transportation it's apparent that it doesn't always get his medicines.   Review of Systems  Constitutional: Positive for activity change. Negative for fever and fatigue.  HENT: Negative for hearing loss, congestion, neck pain and postnasal drip.   Eyes: Negative for discharge, redness and visual disturbance.  Respiratory: Negative for cough, shortness of breath and wheezing.   Cardiovascular: Negative for leg swelling.  Gastrointestinal: Negative for abdominal pain, constipation and abdominal distention.  Genitourinary: Negative for urgency and frequency.  Musculoskeletal: Positive for gait problem. Negative for joint swelling and arthralgias.  Skin: Positive for wound. Negative for color change and rash.  Neurological: Negative for weakness and light-headedness.  Hematological: Negative  for adenopathy.  Psychiatric/Behavioral: Positive for behavioral problems and decreased concentration.   Past Medical History  Diagnosis Date  . Adjustment disorder with depressed mood 06/25/2007  . BACK PAIN, CHRONIC 11/24/2007  . BLADDER CANCER 03/20/2009  . DEPRESSION, CHRONIC 11/24/2007  . DIABETES MELLITUS, TYPE II 06/01/2007  . GERD 04/20/2008  . HEPATITIS B, HX OF 06/01/2007  . HYPERLIPIDEMIA, ATHEROGENIC 03/20/2009  . HYPERTENSION 06/01/2007  . PSA, INCREASED 03/20/2009  . TOBACCO USER 12/23/2008   Past Surgical History  Procedure Date  . Ankle su rgery     reports that he has been smoking Cigarettes.  He has been smoking about .5 packs per day. He does not have any smokeless tobacco history on file. He reports that he does not drink alcohol or use illicit drugs. family history includes Cancer in his sister; Diabetes in his daughter; and Ulcers in his father.    . I have spent more than 30 minutes examining this patient face-to-face of which over half was spent in counseling     We reviewed the patient's problems and potential solutions with the son who was present during the examination Objective:   Physical Exam     On physical examination he is elderly African American male in no apparent distress appears moderately confused HEENT reveals arcus senilis pupils were equal round reactive to light and accommodation neck was supple lung fields were clear to auscultation and percussion heart examination revealed a regular rate and rhythm with a 1/6 systolic murmur his abdomen was soft and nontender no focal masses the extremity examination revealed trace edema and a large  plantars wart on the ball of the right foot neurologically he he showed decreased sensation in the foot please see diabetic examDiabetic foot exam:  Left: Reflexes 1+   Vibratory sensation diminished  Proprioception diminished  Sharp/dull discrimination diminished  Filament test absent Right: Reflexes 2+   Vibratory  sensation diminished  Proprioception diminished  Sharp/dull discrimination diminished  Filament test absent     Assessment & Plan:   patient is an elderly African American male with significant issues with compliance both T-tube his living situation and some mild to moderate dementia.   His son is present and we discussed the need to consider placing him in an assisted living do to compliance with his medication and also with meals and his diabetes personal safety is also a consideration when this patient with diabetic neuropathy have to climb stairs every day to get to his house.   we refilled all his medications we spent time counseling him about the need to be compliant with his medications and to follow appropriate diet we also discussed assisted living he requested an electric scooter we felt this would not be safe since he lives in a second-floor apartment as well as decreased mobility may result in further complications and he needs to be active we treated the plantar wart with liquid nitrogen and thoughts on appropriate wound care he will followup in 2 months.   appropriate laboratory values were obtained including b  met and A1c

## 2010-11-23 ENCOUNTER — Telehealth: Payer: Self-pay | Admitting: Internal Medicine

## 2010-11-23 NOTE — Telephone Encounter (Signed)
Was told by Dr Lovell Sheehan that his father would eventually need to be put in a nursing facility or assisted living. Please advise and return his call, asap.

## 2010-11-23 NOTE — Telephone Encounter (Signed)
Spoke to family per Dr. Lovell Sheehan .

## 2010-11-23 NOTE — Telephone Encounter (Signed)
Is that his decision?

## 2010-11-23 NOTE — Telephone Encounter (Signed)
Dr Lovell Sheehan suggested an assisted living, but that is a decision that the pt and the family will have to make not dr Lovell Sheehan

## 2011-01-01 ENCOUNTER — Telehealth: Payer: Self-pay | Admitting: Internal Medicine

## 2011-01-01 NOTE — Telephone Encounter (Signed)
Paper sent her from social services only request last progress notes which was sent to medical records for them to send

## 2011-01-01 NOTE — Telephone Encounter (Signed)
Pts son called req status of paperwork from [facility]. Pts son brought paperwork, with pt, during last ov.

## 2011-01-15 NOTE — Op Note (Signed)
NAME:  PEARSE, SHIFFLER NO.:  [id number]   MEDICAL RECORD NO.:  [id number]          PATIENT TYPE:  AMB   LOCATION:  [facility]                         FACILITY:  [facility]   PHYSICIAN:  Bertram Millard. Dahlstedt, M.D.DATE OF BIRTH:  1941-06-09   DATE OF PROCEDURE:  02/16/2007  DATE OF DISCHARGE:                               OPERATIVE REPORT   PREOPERATIVE DIAGNOSIS:  Hematuria with right ureteral filling defect.   POSTOPERATIVE DIAGNOSIS:  Right ureteral tumors.   PRINCIPAL PROCEDURES:  1. Cystoscopy.  2. Right retrograde ureteral pyelogram.  3. Right ureteroscopy with biopsy of right ureteral tumor.  4. Double-J stent placement.   SURGEON:  Bertram Millard. Dahlstedt, M.D.   ANESTHESIA:  General with LMA.   COMPLICATIONS:  None.   BRIEF HISTORY:  A 70 year old male with intermittent gross hematuria.  Evaluation recently found the patient to have a normal bladder but a  filling defect in his mid ureter.  It was recommended that he undergo  cysto and right retrograde pyelogram as well as right ureteroscopy with  biopsy.  Risks of the procedure have been discussed with the patient  over the phone.  He understands these and desires to proceed.   DESCRIPTION OF PROCEDURE:  The patient was administered preoperative IV  antibiotics.  He was identified, his surgical side marked; and he was  taken to the operating room where general anesthetic was administered.  He was placed in the dorsal lithotomy position.  Genitalia and perineum  were prepped and draped.   A 22-French panendoscope was passed under direct vision through his  urethra.  Minimal obstruction from some bilobar hypertrophy.  Bladder  entered and inspected circumferentially.  No tumors, trabeculations, or  foreign bodies.  Ureteral orifices were normal.  The right ureteral  orifice was cannulated with a 6 open-end catheter.  This showed slight  narrowing of the distal ureter, but a filling defect which did not allow  contrast passed it in the mid-to-lower ureter.   I was able to eventually put a guidewire by this, and dilated the distal  ureter with the inner sheath of an ureteral access catheter.  I then  passed the ureteroscope up the ureter.  There was indeed a ureteral  tumor.  This was a pedunculated tumor without significant papillary  nature.  The base of this tumor seemed to be anterior and fairly  proximal.  I took several biopsies with the ureteral biopsy forceps, and  actually used a nitinol basket to gain a large amount of tissue from  this.  These were all sent as ureteral tumor.  The rest of the  proximal ureter was normal.  Because of the biopsy and instrumentation  of the patient's ureter, I felt that it would be worthwhile to place a  double-J stent.  A 6-French x 24 cm contour stent was placed under  ureteroscopic, cystoscopic, and fluoroscopic guidance.  Good curls were  seen proximally and distally.  The bladder was drained; and the  procedure terminated.  The patient tolerated the procedure well.  He was  awakened and taken to the PACU in  stable condition.   He will follow up with me in approximately 2 weeks.  He will be called  with his pathology.  He was sent home with Bactrim DS 1 p.o. b.i.d. for  three days and Vicodin 1 p.o. q.4 h. p.r.n. pain.      Bertram Millard. Dahlstedt, M.D.  Electronically Signed     SMD/MEDQ  D:  02/16/2007  T:  02/16/2007  Job:  [id number]

## 2011-01-15 NOTE — Op Note (Signed)
NAME:  Micheal Conner, Micheal Conner NO.:  [id number]   MEDICAL RECORD NO.:  [id number]          PATIENT TYPE:  AMB   LOCATION:  [facility]                         FACILITY:  [facility]   PHYSICIAN:  Bertram Millard. 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   PROCEDURE:  Cystoscopy, right retrograde ureteral pyelogram, right  ureteral washings, right ureteroscopy.   SURGEON:  Bertram Millard. Dahlstedt, M.D.   ANESTHESIA:  General with LMA.   COMPLICATIONS:  None.   BRIEF HISTORY:  This 70 year old gentleman was evaluated last year for  gross hematuria.  The only positive finding was a 1-cm tumor in his  right mid to distal ureter.  This was treated with ablation with laser  and BCG treatment with a stent in place.  He presents at this time,  about 6 months after his completion of BCG therapy, for cystoscopy,  ureteroscopy, and ureteral washings.  Recent FISH test was negative.   DESCRIPTION OF PROCEDURE:  Micheal Conner was identified in the holding area,  the surgical side marked, and IV antibiotics were administered  preoperatively.  He was taken to the operating room where general  anesthetic was administered using LMA.  He was placed in the dorsal  lithotomy position.  The genitalia and perineum were prepped and draped.   A 22-French panendoscope was passed through his urethra which was  normal.  Prostate nonobstructive.  Bladder entered and inspected  circumferentially.  No tumors, trabeculations, or foreign bodies.  The  ureteral orifices were normal  in their configuration and location.  Clear urine was seen effluxing from each side.   A 6-French open-ended catheter was placed into the right distal ureter.  A retrograde pyelogram was performed.  There was slight dilatation of  the mid to distal ureter for a length of approximately 3 cm.  However,  no filling defects or strictures were seen.  Pyelocaliceal system  appeared normal on that right side without filling defects.   At this point, the catheter was pushed up to the area where the ureter  was slightly widened.  Saline was used to obtain washings.  These were  sent as right ureteral washings.  They were sent for cytology.   At this point, the open-end catheter was removed.  The cystoscope was  removed as well.  A 6-French short ureteroscope was advanced under  direct vision up into the right ureter.  No lesions were seen along the  course of the ureter.  There was slight dilatation of that one  corresponding area of ureter, but no evidence of recurrent transitional  cell carcinoma was present.  The scope was advanced all the way up into  the renal pelvis.  This was  also normal.  The entire ureter was  visualized with the scope being withdrawn.   At this point, the bladder was drained.  The scope was removed and the  procedure terminated.  He tolerated it well.   He will followup in approximately 1 week.  He was discharged on Cipro  250 b.i.d. for 3 days and hydrocodone, #21, p.o. q.4h. p.r.n. pain.      Bertram Millard. Dahlstedt, M.D.  Electronically Signed     SMD/MEDQ  D:  10/05/2007  T:  10/05/2007  Job:  [id number]   cc:   Stacie Glaze, MD  [address] [place]  [place] [address]

## 2011-01-15 NOTE — Op Note (Signed)
NAME:  Micheal Conner, Micheal Conner NO.:  [id number]   MEDICAL RECORD NO.:  [id number]          PATIENT TYPE:  AMB   LOCATION:  [facility]                         FACILITY:  [facility]   PHYSICIAN:  Bertram Millard. Dahlstedt, M.D.DATE OF BIRTH:  08-31-1941   DATE OF PROCEDURE:  03/23/2007  DATE OF DISCHARGE:                               OPERATIVE REPORT   PREOPERATIVE DIAGNOSIS:  Right ureteral tumor (transitional cell  carcinoma).   POSTOPERATIVE DIAGNOSIS:  Right ureteral tumor (transitional cell  carcinoma).   PRINCIPAL PROCEDURE:  Cystoscopy, right retrograde ureteral pyelogram,  right ureteroscopy, holmium of base of right ureteral tumor, extraction  of right ureteral tumor, double-J stent placement (6-French x 24 cm  contour).   SURGEON:  Bertram Millard. Dahlstedt, M.D.   ANESTHESIA:  General with LMA.   COMPLICATIONS:  None.   BRIEF HISTORY:  A 70 year old male who presented quite a few weeks ago  with hematuria.  He was found to have a very low grade right ureteral  tumor with biopsy histology showing an inverted configuration.  Metastatic survey has been negative.   The patient had undergone previous ureteroscopy and biopsy.  I presented  treatment options - right nephroureterectomy verses right partial  ureterectomy versus cyst endoscopic management of this tumor.  He has  chosen the latter.  He is aware of the risks and complications of the  procedure including but not limited to injury to the ureter, bleeding,  infection, and the further need for surveillance down the road.  He is  also aware of the fact that if we leave the kidney in that he will have  a stent and eventually need BCG treatment to further treat any residual  carcinoma left in his ureter.  He desires to proceed.   DESCRIPTION OF PROCEDURE:  The patient was administered preoperative IV  antibiotics.  His side was marked on the correct side, he used taken to  the operating room where a general anesthetic was  administered using the  LMA.  He was placed in the dorsal lithotomy position.  The genitalia and  perineum were prepped and draped.  A 22-French panendoscope was passed  under direct vision through his urethra.  The prostate was not  obstructed. No urethral lesions were seen.  The bladder was inspected  circumferentially.  No tumors, trabeculations or foreign bodies.   The right ureter was cannulated with a 5-French open-end catheter.  Retrograde was performed showing a widened mid ureter with a filling  defect quite large, approximately 1 cm.  There was a normal-appearing  ureter proximal to this.   I then passed a guidewire up into the right kidney using fluoroscopic  guidance.  The cystoscope was removed.  I was easily able to pass a 6-  [place] short ureteroscope up to the ureteral tumor.  I noted the base of  the tumor was quite small, approximately 3-4 mm in diameter.  This was  at approximately the 2 o'clock position on the ureteral wall.  I then  used the 250 micron fiber at 0.6 joules to ablate the tumor in this  area.  After approximately 20-30 minutes, this was able to separate the  tumor off of the wall.  It was quite difficult to extract this tumor  which had been freed up.  I was finally able to extract it with a  #4  flat wire basket.  We tried 3-French nitinol at first, as well as  graspers.  The latter grasper and basket were unable to totally secure  the tumor from the ureter.  Finally, the four 4-French flat wire was  able to remove this tumor.  It was sent in saline as right ureteral  tumor.   I then reinspected the area where the ureteral tumor had been removed  from the wall.  There was some mild shaggy tissue in this area.  I  ablated the rest of this with the holmium laser.  I did not see any  residual gross tumor in this area.   At this point, I removed the ureteroscope.  Over the guidewire, I then  placed a 6-French 24-cm contour stent.  Good curls were seen  proximally  and distally.  The bladder was drained and the procedure terminated.   The patient was administered a B & O suppository.   The patient was awakened and taken to the PACU in stable condition.  He  will spend the night.  He will be discharged on Bactrim DS 1 p.o. b.i.d.  for 3 days and hydrocodone 1-2 p.o. q.4 h p.r.n. pain (#20).   He will follow-up on April 07, 2007 in my office.  We will discuss BCG  treatment at that time.      Bertram Millard. Dahlstedt, M.D.  Electronically Signed     SMD/MEDQ  D:  03/23/2007  T:  03/23/2007  Job:  [id number]

## 2011-01-18 NOTE — Discharge Summary (Signed)
NAME:  Micheal Conner, Micheal Conner NO.:  [id number]   MEDICAL RECORD NO.:  [id number]          PATIENT TYPE:  INP   LOCATION:  5709                         FACILITY:  [facility]   PHYSICIAN:  Iva Boop, M.D. LHCDATE OF BIRTH:  10-13-1940   DATE OF ADMISSION:  05/18/2005  DATE OF DISCHARGE:  05/19/2005                                 DISCHARGE SUMMARY   ADMITTING DIAGNOSES:  1.  Gastrointestinal bleed, question of recurrent bleeding from ulcer versus      lower source.  2.  Diabetes mellitus.  3.  Obesity.  4.  Hypertension.  5.  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   DISCHARGE DIAGNOSES:  1.  Gastrointestinal bleed, secondary to internal hemorrhoids and      diverticulosis.  2.  Diabetes mellitus.  3.  Obesity.  4.  Hypertension.  5.  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   PROCEDURES:  Colonoscopy May 19, 2005, revealing pandiverticulosis and  grade 3 prolapsing internal hemorrhoids, which were thought to be cause of  bleeding.   DISCHARGE LABORATORY:  Hemoglobin 11.1, down from 11.4.   HOSPITAL COURSE:  The patient was admitted and observed for further  bleeding, which he had none.  He was prepped for colonoscopy and possible  EGD.  Colonoscopy demonstrated grade 3 prolapsing hemorrhoids and  diverticulosis as noted.  He did well with stable vital signs and had no  problems.   DISCHARGE MEDICATIONS:  1.  Bisoprolol/hydrochlorothiazide 2.5/6.25 mg daily.  2.  Protonix 40 mg b.i.d.  3.  Ferrous sulfate 325 mg daily.  4.  Fibersure supplement one each day.  5.  Proctocream HC 2.5% to hemorrhoids 2 to 3 times a day.   FOLLOWUP:  He currently has followup October 5 with Dr. Melvia Heaps.  This  will probably be changed.  Will determine need for followup with Dr. Lovell Sheehan  and appropriate time as well.   DISCHARGE DIET:  High-fiber.   DISCHARGE ACTIVITY:  As tolerated, although no driving or working today.  I  have asked him to return to work on September 19 if he  feels well enough.  He is to call if not.      Iva Boop, M.D. [facility]  Electronically Signed     CEG/MEDQ  D:  05/19/2005  T:  05/20/2005  Job:  [id number]   cc:   Stacie Glaze, M.D. [facility]  [address] [place]  [place] [address]

## 2011-01-18 NOTE — H&P (Signed)
NAME:  Micheal Conner, Micheal Conner NO.:  [id number]   MEDICAL RECORD NO.:  [id number]          PATIENT TYPE:  EMS   LOCATION:  [facility]                         FACILITY:  [facility]   PHYSICIAN:  Valetta Mole. Conner, M.D. [facility] OF BIRTH:  1941/07/10   DATE OF ADMISSION:  05/01/2005  DATE OF DISCHARGE:                                HISTORY & PHYSICAL   CHIEF COMPLAINT:  Lightheadedness.   HISTORY OF PRESENT ILLNESS:  Micheal Conner is a 70 year old male, generally  healthy.  He developed nausea and dizziness approximately 8 p.m. yesterday  and came to the emergency department for evaluation later that evening.  He  recalls having a bloody stool approximately 6 p.m. yesterday.  Prior to  those events, he felt well.  Initially, he was feeling worse, more  lightheaded, no dizziness.  He currently is feeling some better.  He denies  any chest pain, shortness of breath.  He denies any nausea or vomiting,  hematemesis.  He denies any fevers or chills.  He denies any other  associated symptoms.  He denies any abdominal pain.  He denies any other  complaints in the review of systems.  He has had 4 bloody bowel movements  since 6 p.m. yesterday.  He does admit to rare use of aspirin, denies use of  ibuprofen or other nonsteroidal agents.   PAST MEDICAL HISTORY:  His past medical history is significant for  hypertension and an ankle fracture.   MEDICATIONS:  1.  Ziac 6.25 mg.  2.  He takes an aspirin p.r.n.   SOCIAL HISTORY:  He works at the [employer].  He does smoke  approximately 1 pack per day; he does not drink alcohol.   FAMILY HISTORY:  His father died from some sort of cancer in the age range  of 50-70; his mother is alive at 79.   REVIEW OF SYSTEMS:  As above.  He denies absolutely any other complaints on  review of systems.   PHYSICAL EXAMINATION:  VITAL SIGNS:  Pulse 92, blood pressure 110/70,  temperature 98, respirations 14.  GENERAL:  He appears as a well-developed,  well-nourished male in no acute  distress.  He is quite pale.  HEENT:  Atraumatic, normocephalic.  Conjunctivae are pale.  Sclerae are  white.  Extraocular muscles are intact.  Pupils are round.  NECK:  Supple without lymphadenopathy, thyromegaly, jugular venous  distention or carotid bruits.  CHEST:  Clear to auscultation without any increased work of breathing.  CARDIAC:  S1 and S2 are normal without murmurs or gallops.  ABDOMEN:  Obese, active bowel sounds, soft, nontender.  There is no  hepatosplenomegaly.  NEUROLOGIC:  He is alert and oriented without any motor or sensory deficits.  DERMATOLOGIC:  No concerning lesions.  RECTAL:  Heme-positive stool.   LABORATORY DATA:  Laboratories were reviewed; glucose is over 200,  hemoglobin between 9 and 10.   EKG:  Normal sinus rhythm with a normal EKG.   ASSESSMENT AND PLAN:  1.  Gastrointestinal bleed:  I suspect he is bleeding quite rapidly, given      his  examination.  He is not tachycardic at this time.  I think given his      hemoglobin in the 9 range, he needs transfusion.  We will ask the blood      bank to stay 2 units ahead.  The patient will need a gastroenterology      consult.  He says he has had a colonoscopy before; he cannot remember      the details; he said he did not have any polyps; he remembers a Adult nurse      doctor doing it; he says it was about a year ago, but he is due for      another one this year.  That story sounds confusing to me and I am not      sure the patient knows the exact details.   On examination, he did have some suprapubic fullness, dull to percussion.  A  Foley catheter was placed with a significant amount of urinary output  initially.   1.  Hypertension:  Continue Ziac.   1.  Code status:  I discussed code with the patient; he requests full code.      The patient will need an intensive care unit bed, given a      gastrointestinal bleed.      Micheal Conner, M.D. [facility]  Electronically  Signed     BHS/MEDQ  D:  05/01/2005  T:  05/01/2005  Job:  [id number]

## 2011-01-18 NOTE — Discharge Summary (Signed)
NAME:  Micheal Conner, Micheal Conner NO.:  [id number]   MEDICAL RECORD NO.:  [id number]          PATIENT TYPE:  INP   LOCATION:  6708                         FACILITY:  [facility]   PHYSICIAN:  Titus Dubin. Alwyn Ren, M.D. [facility] OF BIRTH:  Jan 09, 1941   DATE OF ADMISSION:  05/01/2005  DATE OF DISCHARGE:  05/04/2005                                 DISCHARGE SUMMARY   ADMITTING DIAGNOSIS:  Gastrointestinal bleed associated with  lightheadedness.   DISCHARGE DIAGNOSES:  1.  Gastric ulcer with active bleed.  2.  Diabetes, new diagnosis.  3.  Hypertension, controlled.  4.  Anemia secondary to the gastric ulcer.   BRIEF HISTORY:  Mr. Sagraves is a 69 year old, African-American male, who  presented with nausea and dizziness the day prior to admission, August 30th.  He noted blood in his stool with the lightheadedness.  He had a total of  four bloody bowel movements prior to admission.  His hypertension was  treated with Ziac 6.25 mg daily.  In the emergency room, blood pressure was  110/70, pulse was 92.  Heme-positive stool was found on rectal exam.   His admission hemoglobin and hematocrit were 9.4 and 27.3 with normochromic,  normocytic indices,suggesting an acute bleed.  His admission glucose was 231  with a BUN of 52 and a creatinine of 1.3.  Hemoglobin A1c was 6.8 indicating  an average blood sugar of approximately 163 over the previous 6 to 12 weeks.  Cardiac enzymes were negative.  EKG was normal.   He was monitored with serial hemoglobins and hematocrits.  He was typed and  crossed, and two units were transfused.   He underwent upper endoscopy by Dr. Barnet Pall on August 30th.  This  revealed an acute gastric ulcer with hemorrhage, and duodenitis with no  hemorrhage.  H. pylori studies were negative.   On the day of discharge, the patient had no chest pain, shortness of breath,  or abdominal pain.  His blood pressure was 145/80, O2 SAT was 93% on room  air.  Respiratory rate was  20, and pulse 77 and regular.  The chest was  clear, and abdomen was nontender.   He related a history of diabetes in his daughter.  He stated that he was on  no diet.   He expressed an interest in stopping smoking.  He had received evaluation  and recommendations from the Diabetic Treatment Team.  He did not want to  schedule outpatient diabetes education until he sees Dr. Lovell Sheehan.  The  assessor stated that he was having difficulty grasping the possibility of  having diabetes.   I explained the significance of the 6.8 hemoglobin A1c and its associated  risk of over 35% of a premature heart attack or stroke, a risk which was  exacerbated or elevated by his smoking.   I recommended that he employ the information provided by the Diabetes  Treatment Team and considering employing the low-carb program Sugar Busters.  The latter may be easier for him as he eats most of his meals outside the  home.   On the day of discharge,  his hemoglobin and hematocrit were 9.1 and 25.8.  Glucose was 129.   He was discharged on Protonix 40 mg twice a day before breakfast and the  evening meal, and iron sulfate 325 mg daily for eight weeks.  No change was  made in his Ziac. Avoidance of ulcer triggers were reviewed (the aspirin  family, alcohol, peppermint, tobacco & caffeine)   He was scheduled to see Dr. Melvia Heaps, October 5th, at 11 a.m. at [facility] office.  He was asked to follow up with Dr. Darryll Capers in 7 to 10  days.  He stated that he had some materials he needed completed for work; he  was asked to drop those by Dr. Lovell Sheehan' office September 5th and schedule an  appointment.   ACTIVITY:  As tolerated.  He will remain out of work until cleared by Dr.  Lovell Sheehan.  A followup hemoglobin and hematocrit would be recommended in Dr.  Lovell Sheehan' office on September 5th.   PROGNOSIS:  Depends on his addressing the risk factors, which include  smoking and poorly controlled diabetes.  He  expressed that he understood  these recommendations and also expressed motivation to intervene and prevent  complications long term.      Titus Dubin. Alwyn Ren, M.D. [facility]  Electronically Signed     WFH/MEDQ  D:  05/04/2005  T:  05/04/2005  Job:  [id number]   cc:   Stacie Glaze, M.D. [facility]  [address] [place]  [place] [address]   Barbette Hair. Arlyce Dice, M.D. [facility]  [address]  [place]  [place] [address]

## 2011-01-18 NOTE — H&P (Signed)
NAME:  REYMOND, MAYNEZ NO.:  [id number]   MEDICAL RECORD NO.:  [id number]          PATIENT TYPE:  INP   LOCATION:  5709                         FACILITY:  [facility]   PHYSICIAN:  Iva Boop, M.D. LHCDATE OF BIRTH:  [birth year]   DATE OF ADMISSION:  05/18/2005  DATE OF DISCHARGE:                                HISTORY & PHYSICAL   CHIEF COMPLAINT:  Bloody stool.   HISTORY:  Mr. Dirr called the answering service today and I spoke to him.  About 9 a.m. he passed a cup of bloody stool with no bowel movement in it.  History significant for gastric ulcer diagnosed May 01, 2005 in the body  of the stomach with negative biopsies, i.e., no malignancy.  It was not  bleeding at the time and he was placed on b.i.d. Protonix 40 mg a day which  he has complied with.  He has not been using nonsteroidals.  There was no  antecedent melena.  His hemoglobin had climbed from the 9 range to 10.4 on  May 08, 2005, it is 11.4 today.  Dr. Weldon Inches saw him in the ER and he  had Hemoccult-positive stool though there was scanty stool present.  He has  not bled since.  He thinks he has a history of bleeding hemorrhoids.  His GI  review of systems is otherwise negative at this time.  He denies chest pain,  shortness of breath, light-headedness, and the remainder of the  comprehensive review of systems is negative at this time.   PAST MEDICAL HISTORY:  1.  Type 2 diabetes mellitus with recent diagnosis.  2.  Obesity.  3.  Hypertension.  4.  Smoker.  5.  Prior ankle fracture.   MEDICATIONS:  1.  Ziac 2.5/6.25 mg per day.  2.  Protonix 40 mg b.i.d.  3.  Iron supplements.   DRUG ALLERGIES:  None known.   SOCIAL HISTORY:  The patient is a smoker, he lives alone, he does not drink  alcohol.  He works at the [employer].   FAMILY HISTORY:  Father had some sort of cancer unknown.  Mother alive age  73.  His father died at about 59 or 74.   PHYSICAL EXAMINATION:  GENERAL:   Obese pleasant black man in no acute  distress.  VITAL SIGNS:  Blood pressure 147/77, pulse 67, respirations 20, temperature  97.1.  EYES:  Anicteric.  MOUTH:  Free of lesions.  LUNGS:  Clear.  NECK:  Supple.  No mass.  CHEST:  Heart S1-S2, no murmurs or gallops.  ABDOMEN:  Obese, soft, nontender, there is no organomegaly or mass palpated  though size precludes adequate exam.  RECTAL:  As above.  LYMPH NODES:  I detect no neck or supraclavicular nodes.  SKIN:  Warm and dry without acute rash.  There is a scar on the right ankle  area.  EXTREMITIES:  Without edema.  NEUROLOGIC:  He is awake, alert and oriented x3.   LABORATORY DATA:  As above.  BMET is pending.  Coagulopathies are normal.  Platelets and white blood cell count are  normal.   ASSESSMENT:  1.  Gastrointestinal bleed.  This is with hematochezia.  There is a blood      loss anemia with this.  Question new lower gastrointestinal bleed      (hemorrhoids, diverticulosis).  He has not had a colonoscopy recently if      at all.  Question if his ulcer is bleeding again and this is just the      beginning of it.  2.  Obesity.  3.  Diabetes mellitus with elevated hemoglobin A1c last admission, new      diagnosis, not on therapy yet.  4.  Hypertension.  5.  Smoker.   PLAN:  Admit, hydrate, serial hemoglobins.  Transfuse if needed.  Upper GI  endoscopy and colonoscopy tomorrow to sort this bleeding out.  Could  potentially go home tomorrow depending upon the findings.  We will cover him  with sliding scale insulin as needed.  Risks, benefits, indications of the  procedure explained to the patient.  He understands and agrees to proceed.      Iva Boop, M.D. [facility]  Electronically Signed     CEG/MEDQ  D:  05/18/2005  T:  05/18/2005  Job:  [id number]   cc:   Stacie Glaze, M.D. [facility]  [address] [place]  [place] [address]

## 2011-02-15 ENCOUNTER — Ambulatory Visit: Payer: Medicare Other | Admitting: Internal Medicine

## 2011-02-28 ENCOUNTER — Ambulatory Visit (INDEPENDENT_AMBULATORY_CARE_PROVIDER_SITE_OTHER): Payer: Medicare Other | Admitting: Internal Medicine

## 2011-02-28 ENCOUNTER — Encounter: Payer: Self-pay | Admitting: Internal Medicine

## 2011-02-28 VITALS — BP 124/80 | HR 72 | Temp 97.9°F | Resp 16 | Ht 68.0 in | Wt 170.0 lb

## 2011-02-28 DIAGNOSIS — E119 Type 2 diabetes mellitus without complications: Secondary | ICD-10-CM

## 2011-02-28 DIAGNOSIS — F17200 Nicotine dependence, unspecified, uncomplicated: Secondary | ICD-10-CM

## 2011-02-28 DIAGNOSIS — I10 Essential (primary) hypertension: Secondary | ICD-10-CM

## 2011-02-28 DIAGNOSIS — F0390 Unspecified dementia without behavioral disturbance: Secondary | ICD-10-CM

## 2011-05-02 ENCOUNTER — Telehealth: Payer: Self-pay | Admitting: Internal Medicine

## 2011-05-02 NOTE — Telephone Encounter (Signed)
Micheal Conner (patient's son)  was present in the office requesting a letter from Dr Lovell Sheehan stating his father's state of mind for court purposes and Assistant Living. Takeru Bose is also in the process of obtaining Power of [phone].

## 2011-05-02 NOTE — Telephone Encounter (Signed)
Pease advise.

## 2011-05-03 NOTE — Telephone Encounter (Signed)
Pt will need to be evaluated by neurology prior to a letter of compitency Refer to neurology

## 2011-05-08 NOTE — Progress Notes (Signed)
  Subjective:    Patient ID: Micheal Conner, male    DOB: Jan 16, 1941, 70 y.o.   MRN: [id number]  HPI Micheal Conner is a 70 year old African American male who presents for followup of his high BP and for his diabetes.  He presents in company of family members who wish to discuss his home environment.  Apparently there is some controversy of a home about how well. He is between his sonogram and an aunt who has provided some care.  He appears well-nourished. There are no signs of visible neglect but I do fear that the patient is not fully able to care for himself in an independent living situation as well as be compliant with his medications.  On several times he is admitted that he was not able to get his medications filled and had to wait until either he got his check or to get a ride to the pharmacy.  Given his risk factors and his past medical history I am worried that he misses many of his medications on an intermittent basis    Review of Systems  Constitutional: Negative for fever and fatigue.  HENT: Negative for hearing loss, congestion, neck pain and postnasal drip.   Eyes: Negative for discharge, redness and visual disturbance.  Respiratory: Negative for cough, shortness of breath and wheezing.   Cardiovascular: Negative for leg swelling.  Gastrointestinal: Negative for abdominal pain, constipation and abdominal distention.  Genitourinary: Negative for urgency and frequency.  Musculoskeletal: Negative for joint swelling and arthralgias.  Skin: Negative for color change and rash.  Neurological: Negative for weakness and light-headedness.  Hematological: Negative for adenopathy.  Psychiatric/Behavioral: Negative for behavioral problems.       Objective:   Physical Exam  Nursing note and vitals reviewed. Constitutional: He appears well-developed and well-nourished.  HENT:  Head: Normocephalic and atraumatic.  Eyes: Conjunctivae are normal. Pupils are equal, round, and reactive  to light.  Neck: Normal range of motion. Neck supple.  Cardiovascular: Normal rate and regular rhythm.   Pulmonary/Chest: Effort normal and breath sounds normal.  Abdominal: Soft. Bowel sounds are normal.  Psychiatric:       Pleasant but moderately confused about his medications Moderate problems with concentration Judgment appears to be fair In our call 3 items at 5 minutes cannot reproduce clock face.          Assessment & Plan:  Moderate dementia of unknown etiology not classically Alzheimer's but could be organic brain or multi-infarct.  Certainly has risk factors of diabetes hypertension hyperlipidemia and continued tobacco abuse.   I do believ be safer in an assisted living facility where he could be monitored.Marland Kitchen although family members have been able to give a basic level of care he is not getting his medications on a regular basis and nutritionally he is cared for adequately however from that medication administration standpoint there have been lapses in his care.   I have spent more than 30 minutes examining this patient face-to-face of which over half was spent in counseling

## 2011-05-13 ENCOUNTER — Telehealth: Payer: Self-pay | Admitting: *Deleted

## 2011-05-13 NOTE — Telephone Encounter (Signed)
To speak to Dr. Lovell Sheehan about this pt, please.

## 2011-05-15 ENCOUNTER — Telehealth: Payer: Self-pay | Admitting: *Deleted

## 2011-05-15 NOTE — Telephone Encounter (Signed)
May filll one out

## 2011-05-15 NOTE — Telephone Encounter (Signed)
Family needs FL2 to get pt into an Assisted Living Facility.

## 2011-05-17 NOTE — Telephone Encounter (Signed)
Completed and will give to family at visit on 9-18 for tb  test

## 2011-05-21 ENCOUNTER — Ambulatory Visit (INDEPENDENT_AMBULATORY_CARE_PROVIDER_SITE_OTHER): Payer: Medicare Other | Admitting: Internal Medicine

## 2011-05-21 DIAGNOSIS — Z0000 Encounter for general adult medical examination without abnormal findings: Secondary | ICD-10-CM

## 2011-05-24 LAB — POCT I-STAT 4, (NA,K, GLUC, HGB,HCT)
Glucose, Bld: 137 — ABNORMAL HIGH
HCT: 45
HCT: 52
Operator id: 280881
Operator id: 280881

## 2011-06-04 ENCOUNTER — Encounter: Payer: Self-pay | Admitting: Internal Medicine

## 2011-06-04 ENCOUNTER — Ambulatory Visit (INDEPENDENT_AMBULATORY_CARE_PROVIDER_SITE_OTHER): Payer: Medicare Other | Admitting: Internal Medicine

## 2011-06-04 VITALS — BP 140/80 | HR 76 | Temp 98.2°F | Resp 16 | Ht 70.0 in | Wt 168.0 lb

## 2011-06-04 DIAGNOSIS — F17200 Nicotine dependence, unspecified, uncomplicated: Secondary | ICD-10-CM

## 2011-06-04 DIAGNOSIS — Z23 Encounter for immunization: Secondary | ICD-10-CM

## 2011-06-04 DIAGNOSIS — E119 Type 2 diabetes mellitus without complications: Secondary | ICD-10-CM

## 2011-06-04 DIAGNOSIS — E785 Hyperlipidemia, unspecified: Secondary | ICD-10-CM

## 2011-06-04 DIAGNOSIS — I10 Essential (primary) hypertension: Secondary | ICD-10-CM

## 2011-06-04 DIAGNOSIS — Z125 Encounter for screening for malignant neoplasm of prostate: Secondary | ICD-10-CM

## 2011-06-04 LAB — PSA: PSA: 0.16 ng/mL (ref 0.10–4.00)

## 2011-06-04 LAB — LIPID PANEL
HDL: 59.7 mg/dL (ref >39.00)
Total CHOL/HDL Ratio: 3
VLDL: 19.4 mg/dL (ref 0.0–40.0)

## 2011-06-04 LAB — BASIC METABOLIC PANEL
Calcium: 9.3 mg/dL (ref 8.4–10.5)
GFR: 100.76 mL/min (ref >60.00)
Potassium: 4.5 mEq/L (ref 3.5–5.1)
Sodium: 141 mEq/L (ref 135–145)

## 2011-06-06 LAB — HEMOGLOBIN A1C: Hgb A1c MFr Bld: 5.8 % (ref 4.6–6.5)

## 2011-06-17 LAB — I-STAT 8, (EC8 V) (CONVERTED LAB)
Acid-Base Excess: 2
Bicarbonate: 29 — ABNORMAL HIGH
HCT: 45
Hemoglobin: 15.3
Operator id: 268271
Potassium: 4.4
Sodium: 144
TCO2: 31

## 2011-06-19 LAB — I-STAT 8, (EC8 V) (CONVERTED LAB)
Bicarbonate: 25.7 — ABNORMAL HIGH
Glucose, Bld: 109 — ABNORMAL HIGH
Potassium: 4.1
TCO2: 27
pCO2, Ven: 41.7 — ABNORMAL LOW
pH, Ven: 7.397 — ABNORMAL HIGH

## 2011-09-02 NOTE — Progress Notes (Signed)
Subjective:    Patient ID: Micheal Conner, male    DOB: 13-Oct-1940, 70 y.o.   MRN: [id number]  HPI Micheal Conner is a 70 year old African American male who presents for followup of diabetes and hypertension.  He is a smoker he continues to smoke on a daily basis.  The patient has a history of bladder cancer hyperlipidemia has risk factors for heart disease including hypertension hyperlipidemia smoking and age.  His mother moderate dementia was transferred to an assisted living facility by a solid for compliance with his diet and medications his blood pressure and his diabetes appeared to have responded to this move   Review of Systems  Constitutional: Negative for fever and fatigue.  HENT: Negative for hearing loss, congestion, neck pain and postnasal drip.   Eyes: Negative for discharge, redness and visual disturbance.  Respiratory: Negative for cough, shortness of breath and wheezing.   Cardiovascular: Negative for leg swelling.  Gastrointestinal: Negative for abdominal pain, constipation and abdominal distention.  Genitourinary: Negative for urgency and frequency.  Musculoskeletal: Negative for joint swelling and arthralgias.  Skin: Negative for color change and rash.  Neurological: Negative for weakness and light-headedness.  Hematological: Negative for adenopathy.  Psychiatric/Behavioral: Negative for behavioral problems.   Past Medical History  Diagnosis Date  . Adjustment disorder with depressed mood 06/25/2007  . BACK PAIN, CHRONIC 11/24/2007  . BLADDER CANCER 03/20/2009  . DEPRESSION, CHRONIC 11/24/2007  . DIABETES MELLITUS, TYPE II 06/01/2007  . GERD 04/20/2008  . HEPATITIS B, HX OF 06/01/2007  . HYPERLIPIDEMIA, ATHEROGENIC 03/20/2009  . HYPERTENSION 06/01/2007  . PSA, INCREASED 03/20/2009  . TOBACCO USER 12/23/2008    History   Social History  . Marital Status: Single    Spouse Name: N/A    Number of Children: N/A  . Years of Education: N/A   Occupational History    . retired    Social History Main Topics  . Smoking status: Current Everyday Smoker -- 0.5 packs/day    Types: Cigarettes  . Smokeless tobacco: Not on file   Comment: HAS SMOKED ALMOST ALL OF LIFE 1/2 PPD  . Alcohol Use: No  . Drug Use: No  . Sexually Active: No   Other Topics Concern  . Not on file   Social History Narrative  . No narrative on file    Past Surgical History  Procedure Date  . Ankle su rgery     Family History  Problem Relation Age of Onset  . Ulcers Father   . Cancer Sister   . Diabetes Daughter     No Known Allergies  Current Outpatient Prescriptions on File Prior to Visit  Medication Sig Dispense Refill  . bisoprolol-hydrochlorothiazide (ZIAC) 10-6.25 MG per tablet Take 1 tablet by mouth daily.  30 tablet  11  . citalopram (CELEXA) 20 MG tablet Take 1 tablet (20 mg total) by mouth daily.  30 tablet  11  . glimepiride (AMARYL) 2 MG tablet Take 1 tablet (2 mg total) by mouth every morning before breakfast.  30 tablet  11  . omeprazole (PRILOSEC) 40 MG capsule Take 40 mg by mouth daily.        . Pyridoxine HCl (VITAMIN B-6) 100 MG tablet Take 100 mg by mouth daily.        . ranitidine (ZANTAC) 300 MG capsule Take 1 capsule (300 mg total) by mouth every evening.  30 capsule  11    BP 140/80  Pulse 76  Temp 98.2 F (36.8 C)  Resp  16  Ht 5\' 10"  (1.778 m)  Wt 168 lb (76.204 kg)  BMI 24.11 kg/m2       Objective:   Physical Exam  Constitutional: He appears well-developed and well-nourished.  HENT:  Head: Normocephalic and atraumatic.  Eyes: Conjunctivae are normal. Pupils are equal, round, and reactive to light.  Neck: Normal range of motion. Neck supple.  Cardiovascular: Normal rate and regular rhythm.   Pulmonary/Chest: Effort normal and breath sounds normal.  Abdominal: Soft. Bowel sounds are normal.          Assessment & Plan:  Uncontrolled hypertension noted with compliance with his medication discussion of his diabetes making  sure that at the assisted-living facility he is on a diabetic diet.  Urged patient to discontinue smoking Review of medications potential side effects and compliance issues with his son

## 2011-09-04 ENCOUNTER — Ambulatory Visit: Payer: Medicare Other | Admitting: Internal Medicine

## 2011-10-17 ENCOUNTER — Other Ambulatory Visit: Payer: Self-pay | Admitting: *Deleted

## 2011-10-17 MED ORDER — OMEPRAZOLE 40 MG PO CPDR: 40.0000 mg | DELAYED_RELEASE_CAPSULE | Freq: Every day | ORAL | Status: AC

## 2011-11-15 ENCOUNTER — Other Ambulatory Visit: Payer: Self-pay | Admitting: *Deleted

## 2011-11-15 DIAGNOSIS — I10 Essential (primary) hypertension: Secondary | ICD-10-CM

## 2011-11-15 DIAGNOSIS — E119 Type 2 diabetes mellitus without complications: Secondary | ICD-10-CM

## 2011-11-15 DIAGNOSIS — F329 Major depressive disorder, single episode, unspecified: Secondary | ICD-10-CM

## 2011-11-15 MED ORDER — BISOPROLOL-HYDROCHLOROTHIAZIDE 10-6.25 MG PO TABS: 1.0000 | ORAL_TABLET | Freq: Every day | ORAL | Status: AC

## 2011-11-15 MED ORDER — CITALOPRAM HYDROBROMIDE 20 MG PO TABS: 20.0000 mg | ORAL_TABLET | Freq: Every day | ORAL | Status: AC

## 2011-11-15 MED ORDER — GLIMEPIRIDE 2 MG PO TABS: 2.0000 mg | ORAL_TABLET | Freq: Every day | ORAL | Status: AC

## 2012-03-02 ENCOUNTER — Ambulatory Visit: Payer: Self-pay | Admitting: Internal Medicine

## 2012-03-14 ENCOUNTER — Inpatient Hospital Stay: Payer: Self-pay | Admitting: Internal Medicine

## 2012-03-14 ENCOUNTER — Ambulatory Visit: Payer: Self-pay | Admitting: Urology

## 2012-03-14 LAB — COMPREHENSIVE METABOLIC PANEL
Albumin: 3.3 g/dL — ABNORMAL LOW (ref 3.4–5.0)
Alkaline Phosphatase: 99 U/L (ref 50–136)
BUN: 47 mg/dL — ABNORMAL HIGH (ref 7–18)
Bilirubin,Total: 0.3 mg/dL (ref 0.2–1.0)
Chloride: 113 mmol/L — ABNORMAL HIGH (ref 98–107)
Co2: 20 mmol/L — ABNORMAL LOW (ref 21–32)
EGFR (African American): 12 — ABNORMAL LOW
Glucose: 63 mg/dL — ABNORMAL LOW (ref 65–99)
Osmolality: 297 (ref 275–301)
Potassium: 4.3 mmol/L (ref 3.5–5.1)
SGOT(AST): 20 U/L (ref 15–37)
SGPT (ALT): 13 U/L
Sodium: 144 mmol/L (ref 136–145)
Total Protein: 7.6 g/dL (ref 6.4–8.2)

## 2012-03-14 LAB — CBC
HGB: 9.5 g/dL — ABNORMAL LOW (ref 13.0–18.0)
MCH: 30.9 pg (ref 26.0–34.0)
MCHC: 32.5 g/dL (ref 32.0–36.0)
MCV: 95 fL (ref 80–100)
Platelet: 171 10*3/uL (ref 150–440)

## 2012-03-14 LAB — PROTIME-INR
INR: 1
Prothrombin Time: 13.9 secs (ref 11.5–14.7)

## 2012-03-14 LAB — URINALYSIS, COMPLETE

## 2012-03-14 LAB — APTT: Activated PTT: 31.6 secs (ref 23.6–35.9)

## 2012-03-14 LAB — HEMOGLOBIN
HGB: 7.1 g/dL — ABNORMAL LOW (ref 13.0–18.0)
HGB: 8.3 g/dL — ABNORMAL LOW (ref 13.0–18.0)
HGB: 8.6 g/dL — ABNORMAL LOW (ref 13.0–18.0)

## 2012-03-14 LAB — TROPONIN I: Troponin-I: 0.04 ng/mL

## 2012-03-15 LAB — CBC WITH DIFFERENTIAL/PLATELET
Basophil #: 0.1 10*3/uL (ref 0.0–0.1)
Eosinophil #: 0.9 10*3/uL — ABNORMAL HIGH (ref 0.0–0.7)
HCT: 25.7 % — ABNORMAL LOW (ref 40.0–52.0)
HGB: 8.4 g/dL — ABNORMAL LOW (ref 13.0–18.0)
Lymphocyte #: 1.9 10*3/uL (ref 1.0–3.6)
Lymphocyte %: 15.9 %
MCH: 31.7 pg (ref 26.0–34.0)
MCHC: 32.8 g/dL (ref 32.0–36.0)
Monocyte %: 8 %
Neutrophil #: 8.1 10*3/uL — ABNORMAL HIGH (ref 1.4–6.5)
Neutrophil %: 68 %
WBC: 11.9 10*3/uL — ABNORMAL HIGH (ref 3.8–10.6)

## 2012-03-15 LAB — HEMOGLOBIN
HGB: 9.1 g/dL — ABNORMAL LOW (ref 13.0–18.0)
HGB: 8.9 g/dL — ABNORMAL LOW (ref 13.0–18.0)

## 2012-03-15 LAB — BASIC METABOLIC PANEL
Anion Gap: 11 (ref 7–16)
Calcium, Total: 8.2 mg/dL — ABNORMAL LOW (ref 8.5–10.1)
EGFR (African American): 14 — ABNORMAL LOW
EGFR (Non-African Amer.): 12 — ABNORMAL LOW
Glucose: 84 mg/dL (ref 65–99)
Osmolality: 298 (ref 275–301)

## 2012-03-15 LAB — HEMATOCRIT: HCT: 26.4 % — ABNORMAL LOW (ref 40.0–52.0)

## 2012-03-15 LAB — URINE CULTURE

## 2012-03-16 LAB — BASIC METABOLIC PANEL
BUN: 40 mg/dL — ABNORMAL HIGH (ref 7–18)
Calcium, Total: 8 mg/dL — ABNORMAL LOW (ref 8.5–10.1)
Chloride: 113 mmol/L — ABNORMAL HIGH (ref 98–107)
EGFR (African American): 15 — ABNORMAL LOW
EGFR (Non-African Amer.): 13 — ABNORMAL LOW
Osmolality: 296 (ref 275–301)
Potassium: 4.7 mmol/L (ref 3.5–5.1)

## 2012-03-16 LAB — CBC WITH DIFFERENTIAL/PLATELET
Basophil %: 0.4 %
Eosinophil #: 0.6 10*3/uL (ref 0.0–0.7)
Eosinophil %: 5.9 %
HCT: 24.3 % — ABNORMAL LOW (ref 40.0–52.0)
HGB: 8.3 g/dL — ABNORMAL LOW (ref 13.0–18.0)
Lymphocyte %: 15 %
Monocyte %: 7.3 %
Neutrophil %: 71.4 %
RBC: 2.52 10*6/uL — ABNORMAL LOW (ref 4.40–5.90)
RDW: 16.7 % — ABNORMAL HIGH (ref 11.5–14.5)

## 2012-03-17 LAB — CBC WITH DIFFERENTIAL/PLATELET
Basophil #: 0 10*3/uL (ref 0.0–0.1)
Eosinophil #: 0.8 10*3/uL — ABNORMAL HIGH (ref 0.0–0.7)
Eosinophil %: 6.4 %
Lymphocyte #: 1.6 10*3/uL (ref 1.0–3.6)
Lymphocyte %: 12.3 %
MCH: 30.8 pg (ref 26.0–34.0)
MCV: 96 fL (ref 80–100)
Monocyte #: 1 x10 3/mm (ref 0.2–1.0)
Neutrophil #: 9.4 10*3/uL — ABNORMAL HIGH (ref 1.4–6.5)
Neutrophil %: 73 %
Platelet: 150 10*3/uL (ref 150–440)
RBC: 2.7 10*6/uL — ABNORMAL LOW (ref 4.40–5.90)
RDW: 16.5 % — ABNORMAL HIGH (ref 11.5–14.5)

## 2012-03-17 LAB — BASIC METABOLIC PANEL
Anion Gap: 8 (ref 7–16)
Chloride: 115 mmol/L — ABNORMAL HIGH (ref 98–107)
Co2: 20 mmol/L — ABNORMAL LOW (ref 21–32)
Creatinine: 4.45 mg/dL — ABNORMAL HIGH (ref 0.60–1.30)
Osmolality: 296 (ref 275–301)
Potassium: 4.9 mmol/L (ref 3.5–5.1)
Sodium: 143 mmol/L (ref 136–145)

## 2012-03-18 LAB — PATHOLOGY REPORT

## 2012-03-19 LAB — CBC WITH DIFFERENTIAL/PLATELET
Basophil #: 0.1 10*3/uL (ref 0.0–0.1)
Basophil %: 0.5 %
Eosinophil #: 0.3 10*3/uL (ref 0.0–0.7)
HCT: 28.2 % — ABNORMAL LOW (ref 40.0–52.0)
HGB: 9.1 g/dL — ABNORMAL LOW (ref 13.0–18.0)
Lymphocyte #: 1.2 10*3/uL (ref 1.0–3.6)
MCH: 32.1 pg (ref 26.0–34.0)
MCHC: 32.4 g/dL (ref 32.0–36.0)
MCV: 99 fL (ref 80–100)
Monocyte %: 8.1 %
Neutrophil #: 8.6 10*3/uL — ABNORMAL HIGH (ref 1.4–6.5)
RBC: 2.85 10*6/uL — ABNORMAL LOW (ref 4.40–5.90)
RDW: 17.6 % — ABNORMAL HIGH (ref 11.5–14.5)
WBC: 11.1 10*3/uL — ABNORMAL HIGH (ref 3.8–10.6)

## 2012-03-19 LAB — BASIC METABOLIC PANEL
Anion Gap: 9 (ref 7–16)
BUN: 58 mg/dL — ABNORMAL HIGH (ref 7–18)
Chloride: 113 mmol/L — ABNORMAL HIGH (ref 98–107)
Creatinine: 7.55 mg/dL — ABNORMAL HIGH (ref 0.60–1.30)
EGFR (African American): 8 — ABNORMAL LOW
EGFR (Non-African Amer.): 7 — ABNORMAL LOW
Glucose: 94 mg/dL (ref 65–99)
Sodium: 143 mmol/L (ref 136–145)

## 2012-03-20 LAB — CBC WITH DIFFERENTIAL/PLATELET
Basophil #: 0 10*3/uL (ref 0.0–0.1)
Basophil %: 0.3 %
Eosinophil #: 0.6 10*3/uL (ref 0.0–0.7)
Eosinophil %: 5.5 %
HCT: 25.1 % — ABNORMAL LOW (ref 40.0–52.0)
HGB: 8.3 g/dL — ABNORMAL LOW (ref 13.0–18.0)
Lymphocyte #: 1 10*3/uL (ref 1.0–3.6)
MCH: 31.8 pg (ref 26.0–34.0)
MCHC: 33.2 g/dL (ref 32.0–36.0)
MCV: 96 fL (ref 80–100)
Monocyte #: 0.7 x10 3/mm (ref 0.2–1.0)
Neutrophil #: 8.2 10*3/uL — ABNORMAL HIGH (ref 1.4–6.5)
Neutrophil %: 77.9 %
RBC: 2.62 10*6/uL — ABNORMAL LOW (ref 4.40–5.90)

## 2012-03-20 LAB — BASIC METABOLIC PANEL
Anion Gap: 10 (ref 7–16)
Calcium, Total: 9.3 mg/dL (ref 8.5–10.1)
Chloride: 123 mmol/L — ABNORMAL HIGH (ref 98–107)
Co2: 20 mmol/L — ABNORMAL LOW (ref 21–32)
Creatinine: 5.59 mg/dL — ABNORMAL HIGH (ref 0.60–1.30)
EGFR (African American): 11 — ABNORMAL LOW
Glucose: 106 mg/dL — ABNORMAL HIGH (ref 65–99)
Osmolality: 318 (ref 275–301)
Potassium: 4.5 mmol/L (ref 3.5–5.1)
Sodium: 153 mmol/L — ABNORMAL HIGH (ref 136–145)

## 2012-03-20 LAB — PATHOLOGY REPORT

## 2012-03-21 LAB — CBC WITH DIFFERENTIAL/PLATELET
Basophil #: 0.1 10*3/uL (ref 0.0–0.1)
Eosinophil #: 0.8 10*3/uL — ABNORMAL HIGH (ref 0.0–0.7)
HCT: 26.3 % — ABNORMAL LOW (ref 40.0–52.0)
HGB: 8.7 g/dL — ABNORMAL LOW (ref 13.0–18.0)
Lymphocyte %: 13.3 %
MCH: 31.9 pg (ref 26.0–34.0)
MCHC: 32.9 g/dL (ref 32.0–36.0)
Monocyte #: 0.9 x10 3/mm (ref 0.2–1.0)
Neutrophil #: 10.8 10*3/uL — ABNORMAL HIGH (ref 1.4–6.5)
Neutrophil %: 74.8 %
RDW: 17.3 % — ABNORMAL HIGH (ref 11.5–14.5)

## 2012-03-21 LAB — BASIC METABOLIC PANEL
Anion Gap: 11 (ref 7–16)
BUN: 43 mg/dL — ABNORMAL HIGH (ref 7–18)
EGFR (African American): 17 — ABNORMAL LOW
EGFR (Non-African Amer.): 14 — ABNORMAL LOW
Glucose: 114 mg/dL — ABNORMAL HIGH (ref 65–99)
Osmolality: 317 (ref 275–301)

## 2012-03-22 LAB — BASIC METABOLIC PANEL
Anion Gap: 9 (ref 7–16)
BUN: 37 mg/dL — ABNORMAL HIGH (ref 7–18)
Calcium, Total: 8.6 mg/dL (ref 8.5–10.1)
Co2: 22 mmol/L (ref 21–32)
Creatinine: 3.12 mg/dL — ABNORMAL HIGH (ref 0.60–1.30)
EGFR (African American): 22 — ABNORMAL LOW
EGFR (Non-African Amer.): 19 — ABNORMAL LOW
Glucose: 117 mg/dL — ABNORMAL HIGH (ref 65–99)
Sodium: 148 mmol/L — ABNORMAL HIGH (ref 136–145)

## 2012-03-22 LAB — CBC WITH DIFFERENTIAL/PLATELET
Basophil #: 0.1 10*3/uL (ref 0.0–0.1)
Basophil %: 0.5 %
Eosinophil #: 0.7 10*3/uL (ref 0.0–0.7)
Eosinophil %: 6.3 %
HCT: 26 % — ABNORMAL LOW (ref 40.0–52.0)
HGB: 8.6 g/dL — ABNORMAL LOW (ref 13.0–18.0)
Lymphocyte #: 1.7 10*3/uL (ref 1.0–3.6)
MCH: 32.4 pg (ref 26.0–34.0)
MCHC: 33.3 g/dL (ref 32.0–36.0)
MCV: 97 fL (ref 80–100)
Monocyte #: 0.8 x10 3/mm (ref 0.2–1.0)
Neutrophil #: 7.7 10*3/uL — ABNORMAL HIGH (ref 1.4–6.5)
Neutrophil %: 70.7 %
RBC: 2.67 10*6/uL — ABNORMAL LOW (ref 4.40–5.90)
RDW: 17 % — ABNORMAL HIGH (ref 11.5–14.5)

## 2012-03-23 LAB — BASIC METABOLIC PANEL
Anion Gap: 12 (ref 7–16)
BUN: 27 mg/dL — ABNORMAL HIGH (ref 7–18)
Calcium, Total: 8.6 mg/dL (ref 8.5–10.1)
Co2: 21 mmol/L (ref 21–32)
Creatinine: 2.34 mg/dL — ABNORMAL HIGH (ref 0.60–1.30)
EGFR (African American): 31 — ABNORMAL LOW
EGFR (Non-African Amer.): 27 — ABNORMAL LOW
Glucose: 105 mg/dL — ABNORMAL HIGH (ref 65–99)
Osmolality: 292 (ref 275–301)
Sodium: 144 mmol/L (ref 136–145)

## 2012-03-24 LAB — BASIC METABOLIC PANEL
Anion Gap: 10 (ref 7–16)
BUN: 21 mg/dL — ABNORMAL HIGH (ref 7–18)
Calcium, Total: 8.4 mg/dL — ABNORMAL LOW (ref 8.5–10.1)
Chloride: 108 mmol/L — ABNORMAL HIGH (ref 98–107)
Co2: 23 mmol/L (ref 21–32)
Creatinine: 2.39 mg/dL — ABNORMAL HIGH (ref 0.60–1.30)
EGFR (Non-African Amer.): 26 — ABNORMAL LOW
Potassium: 3.8 mmol/L (ref 3.5–5.1)
Sodium: 141 mmol/L (ref 136–145)

## 2012-03-25 LAB — CBC WITH DIFFERENTIAL/PLATELET
Basophil #: 0 10*3/uL (ref 0.0–0.1)
Basophil %: 0.3 %
Eosinophil #: 0.6 10*3/uL (ref 0.0–0.7)
HCT: 24.7 % — ABNORMAL LOW (ref 40.0–52.0)
Lymphocyte %: 13.9 %
MCH: 30.3 pg (ref 26.0–34.0)
MCHC: 32 g/dL (ref 32.0–36.0)
Monocyte #: 0.8 x10 3/mm (ref 0.2–1.0)
Monocyte %: 7.9 %
Neutrophil #: 7.6 10*3/uL — ABNORMAL HIGH (ref 1.4–6.5)
Neutrophil %: 72.1 %
Platelet: 187 10*3/uL (ref 150–440)
RBC: 2.61 10*6/uL — ABNORMAL LOW (ref 4.40–5.90)
RDW: 16.4 % — ABNORMAL HIGH (ref 11.5–14.5)
WBC: 10.5 10*3/uL (ref 3.8–10.6)

## 2012-03-25 LAB — BASIC METABOLIC PANEL
Anion Gap: 9 (ref 7–16)
BUN: 22 mg/dL — ABNORMAL HIGH (ref 7–18)
Chloride: 105 mmol/L (ref 98–107)
Creatinine: 2.14 mg/dL — ABNORMAL HIGH (ref 0.60–1.30)
Osmolality: 279 (ref 275–301)
Potassium: 3.8 mmol/L (ref 3.5–5.1)
Sodium: 137 mmol/L (ref 136–145)

## 2012-03-26 LAB — CBC WITH DIFFERENTIAL/PLATELET
Basophil #: 0.1 10*3/uL (ref 0.0–0.1)
Basophil %: 0.7 %
Eosinophil %: 4.9 %
HCT: 30.4 % — ABNORMAL LOW (ref 40.0–52.0)
HGB: 9.9 g/dL — ABNORMAL LOW (ref 13.0–18.0)
Lymphocyte %: 18.2 %
MCHC: 32.4 g/dL (ref 32.0–36.0)
Neutrophil #: 10.1 10*3/uL — ABNORMAL HIGH (ref 1.4–6.5)
Neutrophil %: 69.5 %
Platelet: 178 10*3/uL (ref 150–440)

## 2012-03-26 LAB — URINALYSIS, COMPLETE
Bacteria: NONE SEEN
Bilirubin,UR: NEGATIVE
Glucose,UR: NEGATIVE mg/dL (ref 0–75)
Ketone: NEGATIVE
Nitrite: NEGATIVE
RBC,UR: 621 /HPF (ref 0–5)
Specific Gravity: 1.008 (ref 1.003–1.030)
Squamous Epithelial: NONE SEEN
WBC UR: 70 /HPF (ref 0–5)

## 2012-03-26 LAB — BASIC METABOLIC PANEL
Anion Gap: 7 (ref 7–16)
BUN: 21 mg/dL — ABNORMAL HIGH (ref 7–18)
Creatinine: 2.05 mg/dL — ABNORMAL HIGH (ref 0.60–1.30)
EGFR (African American): 37 — ABNORMAL LOW
EGFR (Non-African Amer.): 32 — ABNORMAL LOW
Glucose: 93 mg/dL (ref 65–99)
Osmolality: 278 (ref 275–301)

## 2012-03-27 LAB — URINE CULTURE

## 2012-03-28 ENCOUNTER — Ambulatory Visit: Payer: Self-pay | Admitting: Urology

## 2012-03-28 LAB — CBC WITH DIFFERENTIAL/PLATELET
Basophil #: 0 10*3/uL (ref 0.0–0.1)
Basophil %: 0.3 %
Eosinophil #: 0.4 10*3/uL (ref 0.0–0.7)
HCT: 23.8 % — ABNORMAL LOW (ref 40.0–52.0)
HGB: 8.4 g/dL — ABNORMAL LOW (ref 13.0–18.0)
Lymphocyte %: 12 %
Monocyte %: 8.4 %
Neutrophil #: 8.8 10*3/uL — ABNORMAL HIGH (ref 1.4–6.5)
Neutrophil %: 75.7 %
RBC: 2.57 10*6/uL — ABNORMAL LOW (ref 4.40–5.90)
RDW: 16.4 % — ABNORMAL HIGH (ref 11.5–14.5)

## 2012-03-28 LAB — BASIC METABOLIC PANEL
Anion Gap: 9 (ref 7–16)
BUN: 36 mg/dL — ABNORMAL HIGH (ref 7–18)
Chloride: 107 mmol/L (ref 98–107)
Co2: 22 mmol/L (ref 21–32)
Creatinine: 2.93 mg/dL — ABNORMAL HIGH (ref 0.60–1.30)
EGFR (African American): 24 — ABNORMAL LOW
Potassium: 4.2 mmol/L (ref 3.5–5.1)
Sodium: 138 mmol/L (ref 136–145)

## 2012-03-29 LAB — BASIC METABOLIC PANEL
Calcium, Total: 8.4 mg/dL — ABNORMAL LOW (ref 8.5–10.1)
Chloride: 105 mmol/L (ref 98–107)
Co2: 21 mmol/L (ref 21–32)
Creatinine: 3.2 mg/dL — ABNORMAL HIGH (ref 0.60–1.30)
Potassium: 4.2 mmol/L (ref 3.5–5.1)
Sodium: 137 mmol/L (ref 136–145)

## 2012-03-30 LAB — BASIC METABOLIC PANEL
Anion Gap: 11 (ref 7–16)
Calcium, Total: 8.5 mg/dL (ref 8.5–10.1)
Chloride: 105 mmol/L (ref 98–107)
Co2: 21 mmol/L (ref 21–32)
EGFR (African American): 19 — ABNORMAL LOW
Osmolality: 288 (ref 275–301)

## 2012-03-30 LAB — PROTIME-INR: INR: 1

## 2012-04-02 ENCOUNTER — Ambulatory Visit: Payer: Self-pay | Admitting: Internal Medicine

## 2012-04-15 ENCOUNTER — Ambulatory Visit: Payer: Self-pay | Admitting: Urology

## 2012-04-15 LAB — PROTIME-INR
INR: 1
Prothrombin Time: 13.1 secs (ref 11.5–14.7)

## 2012-04-15 LAB — PLATELET COUNT: Platelet: 198 10*3/uL (ref 150–440)

## 2012-04-15 LAB — APTT: Activated PTT: 26.3 secs (ref 23.6–35.9)

## 2012-04-17 ENCOUNTER — Ambulatory Visit: Payer: Self-pay | Admitting: Urology

## 2012-04-17 LAB — BUN: BUN: 48 mg/dL — ABNORMAL HIGH (ref 7–18)

## 2012-04-17 LAB — CREATININE, SERUM
EGFR (African American): 21 — ABNORMAL LOW
EGFR (Non-African Amer.): 18 — ABNORMAL LOW

## 2012-04-28 ENCOUNTER — Ambulatory Visit: Payer: Self-pay | Admitting: Family Medicine

## 2012-04-29 LAB — URINALYSIS, COMPLETE
Glucose,UR: NEGATIVE mg/dL (ref 0–75)
Nitrite: NEGATIVE
Protein: 30
Specific Gravity: 1.01 (ref 1.003–1.030)

## 2012-04-30 ENCOUNTER — Ambulatory Visit: Payer: Self-pay | Admitting: Family Medicine

## 2012-04-30 LAB — CBC WITH DIFFERENTIAL/PLATELET
Basophil %: 0.2 %
Eosinophil #: 0 10*3/uL (ref 0.0–0.7)
Eosinophil %: 0.3 %
HCT: 24.3 % — ABNORMAL LOW (ref 40.0–52.0)
HGB: 8.2 g/dL — ABNORMAL LOW (ref 13.0–18.0)
Lymphocyte %: 5.7 %
MCH: 31.1 pg (ref 26.0–34.0)
MCHC: 33.6 g/dL (ref 32.0–36.0)
Monocyte #: 1.4 x10 3/mm — ABNORMAL HIGH (ref 0.2–1.0)
Monocyte %: 7.7 %
Neutrophil #: 15.4 10*3/uL — ABNORMAL HIGH (ref 1.4–6.5)
Neutrophil %: 86.1 %
Platelet: 264 10*3/uL (ref 150–440)
RBC: 2.63 10*6/uL — ABNORMAL LOW (ref 4.40–5.90)
WBC: 17.8 10*3/uL — ABNORMAL HIGH (ref 3.8–10.6)

## 2012-05-09 ENCOUNTER — Other Ambulatory Visit: Payer: Self-pay | Admitting: Family Medicine

## 2012-05-09 LAB — URINALYSIS, COMPLETE
Bilirubin,UR: NEGATIVE
Glucose,UR: NEGATIVE mg/dL (ref 0–75)
Nitrite: NEGATIVE
Ph: 5 (ref 4.5–8.0)
Protein: NEGATIVE
RBC,UR: 8 /HPF (ref 0–5)
Squamous Epithelial: NONE SEEN

## 2012-05-10 LAB — URINE CULTURE

## 2012-05-20 ENCOUNTER — Ambulatory Visit: Payer: Self-pay | Admitting: Urology

## 2012-05-20 LAB — PROTIME-INR
INR: 1.1
Prothrombin Time: 14.2 secs (ref 11.5–14.7)

## 2012-05-20 LAB — APTT: Activated PTT: 32.9 secs (ref 23.6–35.9)

## 2012-05-20 LAB — PLATELET COUNT: Platelet: 259 10*3/uL (ref 150–440)

## 2012-07-03 ENCOUNTER — Ambulatory Visit: Payer: Self-pay | Admitting: Internal Medicine

## 2012-07-20 ENCOUNTER — Inpatient Hospital Stay: Payer: Self-pay | Admitting: Student

## 2012-07-20 LAB — COMPREHENSIVE METABOLIC PANEL
Albumin: 2.4 g/dL — ABNORMAL LOW (ref 3.4–5.0)
Alkaline Phosphatase: 251 U/L — ABNORMAL HIGH (ref 50–136)
Anion Gap: 6 — ABNORMAL LOW (ref 7–16)
Calcium, Total: 8.4 mg/dL — ABNORMAL LOW (ref 8.5–10.1)
Co2: 22 mmol/L (ref 21–32)
EGFR (Non-African Amer.): 25 — ABNORMAL LOW
Glucose: 141 mg/dL — ABNORMAL HIGH (ref 65–99)
Osmolality: 285 (ref 275–301)
Potassium: 4.7 mmol/L (ref 3.5–5.1)
SGOT(AST): 12 U/L — ABNORMAL LOW (ref 15–37)
SGPT (ALT): 12 U/L (ref 12–78)

## 2012-07-20 LAB — URINALYSIS, COMPLETE
Bilirubin,UR: NEGATIVE
Ketone: NEGATIVE
Nitrite: NEGATIVE
Ph: 6 (ref 4.5–8.0)
Protein: 100
Specific Gravity: 1.006 (ref 1.003–1.030)
WBC UR: 2302 /HPF (ref 0–5)

## 2012-07-20 LAB — IRON AND TIBC
Iron Saturation: 22 %
Iron: 33 ug/dL — ABNORMAL LOW (ref 65–175)
Unbound Iron-Bind.Cap.: 117 ug/dL

## 2012-07-20 LAB — CBC
HGB: 5.4 g/dL — ABNORMAL LOW (ref 13.0–18.0)
MCHC: 29.6 g/dL — ABNORMAL LOW (ref 32.0–36.0)
Platelet: 351 10*3/uL (ref 150–440)
RBC: 2.15 10*6/uL — ABNORMAL LOW (ref 4.40–5.90)
WBC: 12.1 10*3/uL — ABNORMAL HIGH (ref 3.8–10.6)

## 2012-07-20 LAB — FERRITIN: Ferritin (ARMC): 1040 ng/mL — ABNORMAL HIGH (ref 8–388)

## 2012-07-21 LAB — CBC WITH DIFFERENTIAL/PLATELET
Basophil #: 0.1 10*3/uL (ref 0.0–0.1)
Eosinophil #: 0.3 10*3/uL (ref 0.0–0.7)
Eosinophil %: 2.3 %
HCT: 26 % — ABNORMAL LOW (ref 40.0–52.0)
HGB: 9 g/dL — ABNORMAL LOW (ref 13.0–18.0)
MCHC: 34.6 g/dL (ref 32.0–36.0)
Monocyte #: 0.7 x10 3/mm (ref 0.2–1.0)
Monocyte %: 5.8 %
Neutrophil %: 77.6 %
Platelet: 337 10*3/uL (ref 150–440)
RBC: 3.06 10*6/uL — ABNORMAL LOW (ref 4.40–5.90)
WBC: 12.7 10*3/uL — ABNORMAL HIGH (ref 3.8–10.6)

## 2012-07-21 LAB — BASIC METABOLIC PANEL
Anion Gap: 8 (ref 7–16)
BUN: 34 mg/dL — ABNORMAL HIGH (ref 7–18)
Calcium, Total: 8.5 mg/dL (ref 8.5–10.1)
EGFR (African American): 29 — ABNORMAL LOW
EGFR (Non-African Amer.): 25 — ABNORMAL LOW
Glucose: 101 mg/dL — ABNORMAL HIGH (ref 65–99)
Osmolality: 287 (ref 275–301)
Potassium: 4.6 mmol/L (ref 3.5–5.1)
Sodium: 140 mmol/L (ref 136–145)

## 2012-07-22 LAB — URINE CULTURE

## 2012-07-23 LAB — BASIC METABOLIC PANEL
Calcium, Total: 8.1 mg/dL — ABNORMAL LOW (ref 8.5–10.1)
Creatinine: 2.66 mg/dL — ABNORMAL HIGH (ref 0.60–1.30)
EGFR (African American): 27 — ABNORMAL LOW
EGFR (Non-African Amer.): 23 — ABNORMAL LOW
Potassium: 4.2 mmol/L (ref 3.5–5.1)

## 2012-07-23 LAB — CBC WITH DIFFERENTIAL/PLATELET
Basophil #: 0.1 10*3/uL (ref 0.0–0.1)
Basophil %: 0.7 %
Eosinophil %: 3.3 %
HCT: 25 % — ABNORMAL LOW (ref 40.0–52.0)
HGB: 8.1 g/dL — ABNORMAL LOW (ref 13.0–18.0)
Lymphocyte #: 1.4 10*3/uL (ref 1.0–3.6)
MCH: 27.6 pg (ref 26.0–34.0)
MCHC: 32.6 g/dL (ref 32.0–36.0)
MCV: 85 fL (ref 80–100)
Monocyte #: 0.7 x10 3/mm (ref 0.2–1.0)
Monocyte %: 5.6 %
Neutrophil #: 10 10*3/uL — ABNORMAL HIGH (ref 1.4–6.5)
Neutrophil %: 79.1 %
Platelet: 310 10*3/uL (ref 150–440)
RBC: 2.95 10*6/uL — ABNORMAL LOW (ref 4.40–5.90)
WBC: 12.6 10*3/uL — ABNORMAL HIGH (ref 3.8–10.6)

## 2012-07-24 LAB — CBC WITH DIFFERENTIAL/PLATELET
Basophil #: 0 10*3/uL (ref 0.0–0.1)
Basophil %: 0.4 %
Eosinophil %: 1.1 %
HGB: 8.2 g/dL — ABNORMAL LOW (ref 13.0–18.0)
Lymphocyte #: 1 10*3/uL (ref 1.0–3.6)
Lymphocyte %: 8.7 %
Monocyte %: 5.8 %
Neutrophil #: 9.8 10*3/uL — ABNORMAL HIGH (ref 1.4–6.5)
Neutrophil %: 84 %
RBC: 2.96 10*6/uL — ABNORMAL LOW (ref 4.40–5.90)
RDW: 18.2 % — ABNORMAL HIGH (ref 11.5–14.5)
WBC: 11.7 10*3/uL — ABNORMAL HIGH (ref 3.8–10.6)

## 2012-08-02 ENCOUNTER — Ambulatory Visit: Payer: Self-pay | Admitting: Internal Medicine

## 2012-08-14 ENCOUNTER — Ambulatory Visit: Payer: Self-pay | Admitting: Family Medicine

## 2012-08-14 LAB — VANCOMYCIN, TROUGH: Vancomycin, Trough: 6 ug/mL — ABNORMAL LOW (ref 10–20)

## 2012-08-14 LAB — CREATININE, SERUM
Creatinine: 2.66 mg/dL — ABNORMAL HIGH (ref 0.60–1.30)
EGFR (African American): 27 — ABNORMAL LOW
EGFR (Non-African Amer.): 23 — ABNORMAL LOW

## 2012-08-18 ENCOUNTER — Ambulatory Visit: Payer: Self-pay | Admitting: Family Medicine

## 2012-08-18 LAB — CREATININE, SERUM
Creatinine: 3.34 mg/dL — ABNORMAL HIGH (ref 0.60–1.30)
EGFR (Non-African Amer.): 18 — ABNORMAL LOW

## 2012-10-03 DEATH — deceased

## 2014-06-13 IMAGING — CT CT GUIDE TUBE PLACEMENT
1 of 11 series · 2 of 16 positions shown, 5 images · non-contrast
Comparison: none

REASON FOR EXAM: right Ikanda tube
COMMENTS:

[Series 10: (hospital) 2.4 b30s · axial · 0.74mm/px · z∈[-816,-814]mm · 2 of 164 slices shown, 5 images]
[im 55/164  soft-tissue]
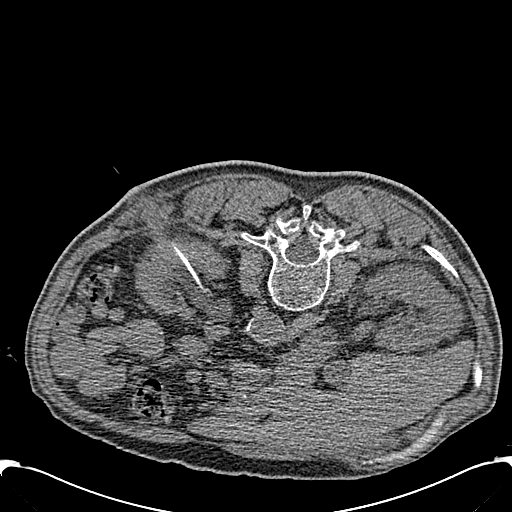
[im 55/164  lung]
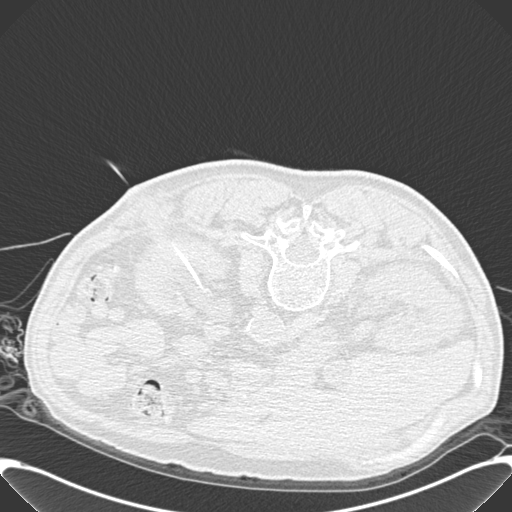
[im 55/164  bone]
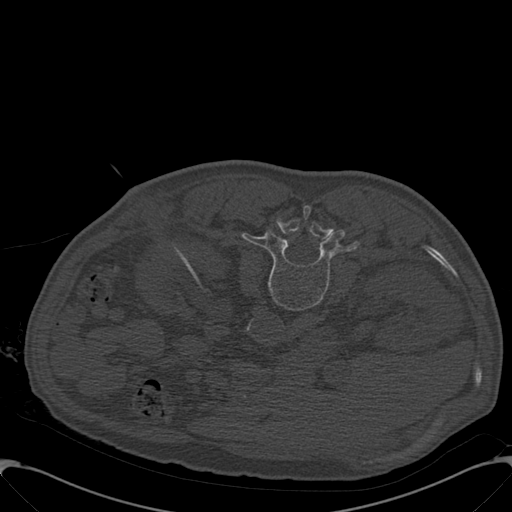
[im 109/164  soft-tissue]
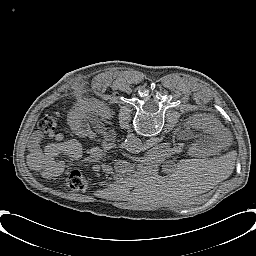
[im 109/164  lung]
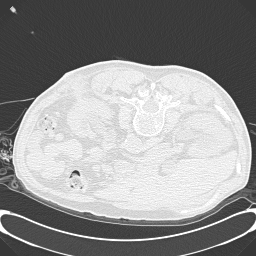

[2 of 16 positions shown; findings below may reference images not displayed]

PROCEDURE:     CT  - CT GUIDED TUBE PLACEMENT  - April 17, 2012 [DATE]

RESULT:

Procedure: The patient's surrogate was informed of the risks and benefits of
the procedure and proper informed consent was obtained. The patient was
brought to the CT suite and an initial prone CT of the abdomen was obtained.
Bilateral hydronephrosis is identified. Uses CT guidance, proper entry site
was established in the right and left kidneys for CT-guided nephrostomy tube
placement.

The areas were prepped and draped in usual sterile fashion.

The patient received conscious sedation via titrated IV doses of Precedex.
The initial loading dose at 0.8 micrograms per hour and a maintenance dose
of 0.9 mcg per hour. The patient also received titrated doses of fentanyl
and Versed 0.5 mcg of Versed and 50 mg of fentanyl.

Using CT guidance, a 10 French ADPL drainage catheter was placed into the
renal pelvis and ureter of the left kidney. The catheter was partially coped
with tip left in the proximal to midureter. This was done with intentions
for future internalization.

Utilizing CT guidance, an 8 French multipurpose drainage catheter was placed
into the renal pelvis of the right kidney with tip the distal portion curled
into the kidney. Excess catheter was also inserted into the renal pelvis due
to the patient's multiple episodes of intended catheter removal.

The patient tolerated the procedure without complications. Appropriate
instructions were placed with the patient on the patient's orders and the
patient was discharged back to the [HOSPITAL].
IMPRESSION: Bilateral CT guided renal catheter drainage placement as described above.

## 2014-12-20 NOTE — Consult Note (Signed)
Chief Complaint:   Subjective/Chief Complaint muscle invasive bladder cancer, bilateral ureteral obstruction, both nephrostomy tubes dislodged   Brief Assessment:   Gastrointestinal details normal Soft  Nontender  No rebound tenderness    Additional Physical Exam ostomy bags on bilateral nephrostomy sites - no urine in bags   Lab Results: Routine Chem:  12-Jul-13 23:27    Creatinine (comp)  5.34  14-Jul-13 07:58    Creatinine (comp)  4.49  15-Jul-13 13:23    Creatinine (comp)  4.40  16-Jul-13 04:18    Creatinine (comp)  4.45  18-Jul-13 11:43    Creatinine (comp)  7.55  19-Jul-13 07:47    Creatinine (comp)  5.59  20-Jul-13 06:13    Creatinine (comp)  3.96  21-Jul-13 03:21    Creatinine (comp)  3.12  22-Jul-13 07:05    Creatinine (comp)  2.34  23-Jul-13 06:22    Creatinine (comp)  2.39  24-Jul-13 06:59    Creatinine (comp)  2.14  25-Jul-13 04:46    Creatinine (comp)  2.05  27-Jul-13 04:56    Creatinine (comp)  2.93  28-Jul-13 02:08    Creatinine (comp)  3.20   Assessment/Plan:  Invasive Device Daily Assessment of Necessity:   Does the patient currently have any of the following indwelling devices? none   Assessment/Plan:   Assessment 74 yo male with muscle invasive bladder cancer, bilateral ureteral obstruction. Acute on chronic renal failure since nephrostomy tube removal (right side by patient, left side not in position per PET/CT)    Plan 1) NPO after midnight for bilateral nephrostomy tube placement in A.M. by interventional radiology.   2) Patient's family declines cystoprostatectomy - goals of care palliative 3) Internalization to JJ ureter stents desirable due to patient's tendency to pull drains/tubes   Electronic Signatures: Jeraldine Loots (MD)  (Signed 28-Jul-13 12:01)  Authored: Chief Complaint, Brief Assessment, Lab Results, Assessment/Plan   Last Updated: 28-Jul-13 12:01 by Jeraldine Loots (MD)

## 2014-12-20 NOTE — Consult Note (Signed)
Chief Complaint:   Subjective/Chief Complaint Pt not seen- chart reviewed and path d/w Dr. Dicie Beam.  good nephrostomy output. minimal Foley output since neph tubes placed. creat 5.59   VITAL SIGNS/ANCILLARY NOTES: **Vital Signs.:   19-Jul-13 11:22   Vital Signs Type Q 4hr   Temperature Temperature (F) 98.5   Celsius 36.9   Temperature Source oral   Pulse Pulse 79   Respirations Respirations 18   Systolic BP Systolic BP 403   Diastolic BP (mmHg) Diastolic BP (mmHg) 81   Mean BP 107   Pulse Ox % Pulse Ox % 93   Pulse Ox Activity Level  At rest   Oxygen Delivery Room Air/ 21 %   Lab Results: Routine Chem:  12-Jul-13 23:27    Creatinine (comp)  5.34  14-Jul-13 07:58    Creatinine (comp)  4.49  15-Jul-13 13:23    Creatinine (comp)  4.40  16-Jul-13 04:18    Creatinine (comp)  4.45  18-Jul-13 11:43    Creatinine (comp)  7.55  19-Jul-13 07:47    Creatinine (comp)  5.59   Assessment/Plan:  Assessment/Plan:   Assessment 1. 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   2. Bilat ureteral obstruction secondary to above s/p bilat neph tubes    Plan 1. He is not an ideal cystectomy candidate however if family desires to pursue would need tertiary center eval.  Needs a metastatic eval- ct chest/abd/pelvis w contrast if creatine normalizes.  Consider PET/CT.  Recc oncology eval. Msg left w/ son to discuss path report. 2. Follow creatinine   Electronic Signatures: Abbie Sons (MD)  (Signed 19-Jul-13 11:35)  Authored: Chief Complaint, VITAL SIGNS/ANCILLARY NOTES, Lab Results, Assessment/Plan   Last Updated: 19-Jul-13 11:35 by Abbie Sons (MD)

## 2014-12-20 NOTE — Consult Note (Signed)
Chief Complaint:   Subjective/Chief Complaint Dislodged nephrostomy tube, muscle invasive urothelial carcimona of the bladder   VITAL SIGNS/ANCILLARY NOTES: **Vital Signs.:   26-Jul-13 04:26   Temperature Temperature (F) 97.9   Pulse Pulse 72    14:00   Temperature Temperature (F) 98.2   Pulse Pulse 126    20:13   Temperature Temperature (F) 99.1   Pulse Pulse 95  *Intake and Output.:   26-Jul-13 02:15   Urinary Method  Void; Incontinent; Diaper    03:08   Other Output ml     Out:  750   Other Output ml     Out:  175    04:06   Urinary Method  Void; Incontinent; Diaper    06:25   Urinary Method  Void; Incontinent; Diaper    Shift 07:00   Other Output ml     Out:  750   Other Output ml     Out:  175    Daily 07:00   Other Output ml     Out:  1400   Other Output ml     Out:  475    07:19   Urinary Method  Void   Other Output ml     Out:  525   Other Output ml     Out:  0    Shift 15:00   Other Output ml     Out:  525   Other Output ml     Out:  0   Lab Results: Routine Chem:  12-Jul-13 23:27    Creatinine (comp)  5.34  14-Jul-13 07:58    Creatinine (comp)  4.49  15-Jul-13 13:23    Creatinine (comp)  4.40  16-Jul-13 04:18    Creatinine (comp)  4.45  18-Jul-13 11:43    Creatinine (comp)  7.55  19-Jul-13 07:47    Creatinine (comp)  5.59  20-Jul-13 06:13    Creatinine (comp)  3.96  21-Jul-13 03:21    Creatinine (comp)  3.12  22-Jul-13 07:05    Creatinine (comp)  2.34  23-Jul-13 06:22    Creatinine (comp)  2.39  24-Jul-13 06:59    Glucose, Serum  131   BUN  22   Creatinine (comp)  2.14   Sodium, Serum 137   Potassium, Serum 3.8   Chloride, Serum 105   CO2, Serum 23   Calcium (Total), Serum  8.3   Anion Gap 9   Osmolality (calc) 279   eGFR (African American)  35   eGFR (Non-African American)  30 (eGFR values <48m/min/1.73 m2 may be an indication of chronic kidney disease (CKD). Calculated eGFR is useful in patients with stable renal  function. The eGFR calculation will not be reliable in acutely ill patients when serum creatinine is changing rapidly. It is not useful in  patients on dialysis. The eGFR calculation may not be applicable to patients at the low and high extremes of body sizes, pregnant women, and vegetarians.)  25-Jul-13 04:46    Glucose, Serum 93   BUN  21   Creatinine (comp)  2.05   Sodium, Serum 138   Potassium, Serum 4.3   Chloride, Serum 107   CO2, Serum 24   Calcium (Total), Serum 8.9   Anion Gap 7   Osmolality (calc) 278   eGFR (African American)  37   eGFR (Non-African American)  32 (eGFR values <669mmin/1.73 m2 may be an indication of chronic kidney disease (CKD). Calculated eGFR is useful in patients with stable renal function.  The eGFR calculation will not be reliable in acutely ill patients when serum creatinine is changing rapidly. It is not useful in  patients on dialysis. The eGFR calculation may not be applicable to patients at the low and high extremes of body sizes, pregnant women, and vegetarians.)   Result Comment platelet count - VERIFIED BY SMEAR ESTIMATE  Result(s) reported on 26 Mar 2012 at 06:53AM.  Routine Hem:  24-Jul-13 06:59    WBC (CBC) 10.5   RBC (CBC)  2.61   Hemoglobin (CBC)  7.9   Hematocrit (CBC)  24.7   Platelet Count (CBC) 187   MCV 95   MCH 30.3   MCHC 32.0   RDW  16.4   Neutrophil % 72.1   Lymphocyte % 13.9   Monocyte % 7.9   Eosinophil % 5.8   Basophil % 0.3   Neutrophil #  7.6   Lymphocyte # 1.5   Monocyte # 0.8   Eosinophil # 0.6   Basophil # 0.0 (Result(s) reported on 25 Mar 2012 at 08:10AM.)  25-Jul-13 04:46    WBC (CBC)  14.5   RBC (CBC)  3.19   Hemoglobin (CBC)  9.9   Hematocrit (CBC)  30.4   Platelet Count (CBC) 178   MCV 95   MCH 30.9   MCHC 32.4   RDW  16.6   Neutrophil % 69.5   Lymphocyte % 18.2   Monocyte % 6.7   Eosinophil % 4.9   Basophil % 0.7   Neutrophil #  10.1   Lymphocyte # 2.6   Monocyte # 1.0   Eosinophil  # 0.7   Basophil # 0.1   Assessment/Plan:  Assessment/Plan:   Assessment I was called by nursing for a dislodged L nephrostomy tube and leaking insertion site. By record review, Mr. Greth has muscle invasive bladder cancer and bilateral ureter obstruction.  He was managed with bilateral nephrostomy tubes due to nonvisualization of the ureter orifices at TURBT.  He has poor performance status and was deemed ineligible for neoadjuvant chemotherapy by oncology.  The patient is followed by palliative care and the goals of care are SNF with hospice.   His PET/CT scan 7/24 showed no obvious metastatic disease however the L nephrostomy tube was not in place at that time.  In addition, it has not drained any urine for 24 hours.  Therefore, this is not an acute issue.    Plan 1) Remove stitch holding L nephrostomy in place.  Place an occlusive dressing over the leaking insertion site. 2) I will re-visit goals of care with the patient's family at 50 AM tomorrow (7/27). Options are observe vs. replacement of L nephrostomy tube vs. second look cystoscopy and attempt ureter stent placement 3) NPO after midnight in case intervention is desired   Electronic Signatures: Jeraldine Loots (MD)  (Signed 26-Jul-13 20:48)  Authored: Chief Complaint, VITAL SIGNS/ANCILLARY NOTES, Lab Results, Assessment/Plan   Last Updated: 26-Jul-13 20:48 by Jeraldine Loots (MD)

## 2014-12-20 NOTE — Discharge Summary (Signed)
PATIENT NAME:  Micheal Conner, Micheal Conner MR#:  [id number] DATE OF BIRTH:  21-Dec-1940  DATE OF ADMISSION:  07/20/2012 DATE OF DISCHARGE:  07/25/2012  ADDENDUM: Addendum to discharge summary dictated 07/24/2012 by Dr. Epifanio Lesches.    The patient was not discharged as planned on 07/24/2012 as the nurse thought that perhaps there was some blood in the stool. Hemoglobin was checked yesterday and today. Hemoglobin on 07/23/2012 was 8.1 and yesterday afternoon was 8.2 actually. There is no evidence of acute bleed noted per staff, otherwise. The patient denies any abdominal pain. Hemoglobin was checked again today, which is 9.8. At this point, hemoglobin is stable, blood pressures are stable, and the patient is tolerating a diet and will be discharged with outpatient follow-up and a CBC check within a week. He is to continue the IV antibiotics for an additional 11 days. He is afebrile.   DISCHARGE DIAGNOSES:  1. Acute on chronic anemia.  2. Anemia of chronic disease.  3. Recurrent urinary tract infections.  4. Gastroesophageal reflux disease. 5. Dementia. 6. Chronic pain.  7. Metabolic encephalopathy from urinary tract infection.  8. Benign prostatic hypertrophy.  9. Hypernatremia.  10. Chronic kidney disease, stage IV.  11. Invasive bladder cancer, status post nephrostomy tubes, which have been removed.  12. Hypertension.   DISCHARGE MEDICATIONS:  1. Amlodipine 10 mg daily.  2. Aricept 10 mg daily.  3. Celexa 20 mg daily.  4. Fentanyl patch 25 mcg every three days.  5. Ferrous sulfate 325 mg twice a day. 6. Finasteride 5 mg daily.  7. Flomax 0.4 mg daily.  8. Hydralazine 25 mg four times daily. 9. Lopressor 50 mg twice a day. 10. Milk of Magnesia 30 mL once a day as needed for constipation.  11. Omeprazole 40 mg once a day. 12. Oxycodone 5 mg every four hours as needed for pain.  13. Simvastatin 40 mg daily.  14. Spiriva 18 mcg inhaled one cap daily.  15. Tylenol 650 mg every four hours  as needed for pain or temperature greater than 100.4.  16. Ventolin HFA p.r.n. 2 puffs every four hours as needed. 17. Vitamin B6 100 mg one tab once a day. 18. Ceftazidime 1 gram daily for 11 days. 19. Vitamin D3 5000 international units once a day.  DIET: Low sodium, low fat, low cholesterol, ADA diet.   ACTIVITY: As tolerated.   CODE STATUS: THE PATIENT IS DO NOT RESUSCITATE.   TOTAL TIME SPENT: 35 minutes.  ____________________________ Vivien Presto, MD sa:slb D: 07/25/2012 14:10:13 ET T: 07/25/2012 14:29:25 ET JOB#: [id number]  cc: Vivien Presto, MD, <Dictator> Vivien Presto MD ELECTRONICALLY SIGNED 08/13/2012 11:05

## 2014-12-20 NOTE — Op Note (Signed)
PATIENT NAME:  Micheal Conner, Micheal Conner MR#:  [id number] DATE OF BIRTH:  1940/09/11  DATE OF PROCEDURE:  03/18/2012  PREOPERATIVE DIAGNOSES:  1. Possible bladder mass with history of bladder cancer.  2. Bilateral hydronephrosis.  3. Renal failure.   POSTOPERATIVE DIAGNOSES:  1. Bladder tumor.  2. Bilateral hydronephrosis.  3. Renal failure.   PROCEDURE: Transurethral resection of bladder tumor (large).   SURGEON: Lavetta Geier C. Bernardo Heater, MD   ASSISTANT: None.   ANESTHETIC: MAC.   INDICATION: This is a 74 year old male initially admitted for hematochezia secondary to diverticular bleeding. He has a history of bladder cancer diagnosed approximately two years ago without recent follow-up. Creatinine was 5.4 on admission and renal ultrasound was remarkable for bilateral hydronephrosis and possible bladder mass. With Foley catheter drainage, creatinine has leveled to 4.4. He presents for cystoscopy, possible TURBT and attempt at ureteral stent placement.   FINDINGS:  1. Urethra normal in caliber without stricture.  2. Moderate lateral lobe prostate enlargement.  3. Extensive tumor noted within the bladder base, trigone and lower lateral walls.  4. Ureteral orifices were not visualized and ureteral stents could not be placed.   DESCRIPTION OF PROCEDURE: The patient was taken to the operating room and sedation was obtained by anesthesia. 2% lidocaine gel was instilled per urethra. He was placed in the low lithotomy position and his external genitalia were prepped and draped in the usual fashion. Time-out was performed. A 21 French cystoscope with 30 degree lens was lubricated and passed under direct vision with findings as described above. A 5 French open-ended ureteral catheter was placed in the cystoscope and approximate area of the trigone was probed with a guidewire and no descendible ureteral orifices could be identified on either side. The cystoscope was removed. A continuous flow resectoscope sheath  with visual obturator was lubricated and passed into the bladder. The [facility] resectoscope with loop was then placed in the sheath. Bladder tumor was resected from the lower lateral walls, bladder base. Hemostasis was obtained with cautery. The tumor appeared to involve significant depth of the bladder and at some areas deeper resections were performed and no discernible bladder muscle was identified. The initial tumor was removed with irrigation. A second specimen was sent from base of the bladder tumor. Tumor size was greater than 5 cm after the tumor was resected with the exception of the area of trigone. Once hemostasis was obtained, the area that was felt to be the approximate location of the ureteral orifice was resected and no orifice or efflux of urine was identified. Visual inspection was remarkable for adequate hemostasis. It was ensured all specimen was irrigated from the bladder. The resectoscope was removed. A 22 French Foley catheter was placed with return of clear effluent upon irrigation. We will discuss with Radiology and he will be scheduled for percutaneous nephrostomies. A B and O suppository was inserted per rectum. EBL was minimal.   ____________________________ Ronda Fairly. Bernardo Heater, MD scs:drc D: 03/18/2012 16:42:18 ET T: 03/18/2012 17:32:34 ET JOB#: [id number]  cc: Nicki Reaper C. Bernardo Heater, MD, <Dictator> Abbie Sons MD ELECTRONICALLY SIGNED 03/25/2012 7:26

## 2014-12-20 NOTE — Discharge Summary (Signed)
PATIENT NAME:  Micheal Conner, Micheal Conner MR#:  [id number] DATE OF BIRTH:  1940/09/18  DATE OF ADMISSION:  07/20/2012 DATE OF DISCHARGE:  07/24/2012  DISPOSITION: [facility].  CODE STATUS: DO NOT RESUSCITATE.   CONSULTANTS: Efraim Kaufmann, MD - Palliative Care.  HOSPITAL COURSE: This is a 74 year old male patient with multiple medical problems of advanced bladder cancer nephrostomy tube status post removal, dementia, chronic obstructive pulmonary disease, history of EtOH abuse, hypertension, diabetes, hyperlipidemia, vitamin D deficiency, gastroesophageal reflux disease, and history of frequent urinary tract infections before who came in because of anemia. The patient was at [facility] with hospice and sent here on 07/20/2012 for low hemoglobin on routine analysis. The patient's was found to have a hemoglobin of 5.4 and hematocrit 18.1 with no gross evidence of bleeding. The patient was admitted to hospitalist service for acute anemia. He received 2 units of transfusion and the patient's hemoglobin improved. Hemoglobin is 9 and hematocrit is 26. The patient has no evidence of bleeding at this time. The patient will be going back to [facility].   For urinary tract infection, she has history of recurrent UTIs with extended-spectrum beta-lactamase before, but this time urine culture showed more than 100,000 colonies of Pseudomonas sensitive to [place]. The patient got a PICC line yesterday and he will be going home with [facility]ress[facility] for two weeks; this will be 1 gram IV daily. He will be getting repeat urine cultures after the course is finished.   The patient had elevated white count, probably due to UTI, white was 12.1 and stayed around the same, 12.6 yesterday. The patient will be going home with [place].   For chronic kidney disease, stage IV, secondary to bladder cancer,  The patient had discussion with the family during the last time and they decided about not doing any surgery and he had a nephrostomy  tube during the last admission, they were removed.   The patient has a history of GI bleed before and diverticular bleed. The patient right now is not bleeding at all.   The patient has history of diabetes mellitus, type II. He is on a low sodium, ADA diet and his blood sugars have been around 120s and 90s. He will be on sliding scale with coverage only. He cannot be on any medications like metformin due to his kidney disease.   The patient also has hypertension. He is controlled well with oral medications. Blood pressure this morning is 131/71 and pulse is 78. He is on amlodipine 10 mg daily and hydralazine 25 mg four times daily along with Lopressor 50 mg p.o. which can be continued.   For history of benign prostatic hypertrophy, he is on finasteride and Flomax. That can be continued.  For iron deficiency anemia, he is on ferrous sulfate. Continue that.   For chronic pain, he is on fentanyl patch 25 mcg every 72 hours. Continue that.   He was lethargic when he came and at that time we stopped Celexa and also oxycodone because of lethargy. He actually improved his mental status nicely and he is able to eat the food. So when he goes back to [facility] he needs to have assessment and see if he really needs oxycodone or not and just continue the fentanyl patch. He was also seen by the palliative care team here. The patient's son and the family decided about DNR because of his poor prognosis with advanced cancer. He needs repeat urine cultures after two weeks.  CODE STATUS: DO NOT  RESUSCITATE.  TIME SPENT ON DISCHARGE PREPARATION: More than 30 minutes. ____________________________ Epifanio Lesches, MD sk:slb D: 07/24/2012 10:44:14 ET     T: 07/24/2012 12:46:18 ET        JOB#: [id number] cc: Epifanio Lesches, MD, <Dictator> Epifanio Lesches MD ELECTRONICALLY SIGNED 08/18/2012 14:56

## 2014-12-20 NOTE — Consult Note (Signed)
Follow up  from Dr. Cherylynn Ridges eval. Creatine slightly improved at 4.4. Awaiting non contrast CT abd/pelvis.  Electronic Signatures: Abbie Sons (MD)  (Signed on 15-Jul-13 14:29)  Authored  Last Updated: 15-Jul-13 14:29 by Abbie Sons (MD)

## 2014-12-20 NOTE — Consult Note (Signed)
Chief Complaint:   Subjective/Chief Complaint Seen in IR post neph tube placement which was successful   VITAL SIGNS/ANCILLARY NOTES: **Vital Signs.:   18-Jul-13 14:49   Temperature Temperature (F) 98.1   Temperature Source oral   Pulse Pulse 77   Respirations Respirations 16   Systolic BP Systolic BP 158   Diastolic BP (mmHg) Diastolic BP (mmHg) 83   Telemetry pattern Cardiac Rhythm Normal sinus rhythm   Brief Assessment:   Additional Physical Exam Foley urine rose'   Lab Results:  Routine Chem:  12-Jul-13 23:27    Creatinine (comp)  5.34  14-Jul-13 07:58    Creatinine (comp)  4.49  15-Jul-13 13:23    Creatinine (comp)  4.40  16-Jul-13 04:18    Creatinine (comp)  4.45  18-Jul-13 11:43    Glucose, Serum 94   BUN  58   Creatinine (comp)  7.55   Sodium, Serum 143   Potassium, Serum  5.6   Chloride, Serum  113   CO2, Serum 21   Calcium (Total), Serum 8.6   Anion Gap 9   Osmolality (calc) 301   eGFR (African American)  8   eGFR (Non-African American)  7 (eGFR values <79m/min/1.73 m2 may be an indication of chronic kidney disease (CKD). Calculated eGFR is useful in patients with stable renal function. The eGFR calculation will not be reliable in acutely ill patients when serum creatinine is changing rapidly. It is not useful in  patients on dialysis. The eGFR calculation may not be applicable to patients at the low and high extremes of body sizes, pregnant women, and vegetarians.)   Assessment/Plan:  Assessment/Plan:   Assessment 1. ARF- Bilat ureteral obstruction.  Creatinine 7.5 today- neph tubes have been placed. 2. s/p turbt- stable. intraop findings suspicious for high grade muscle invasive bladder ca.   Electronic Signatures: SAbbie Sons(MD)  (Signed [phone]:[phone])  Authored: Chief Complaint, VITAL SIGNS/ANCILLARY NOTES, Brief Assessment, Lab Results, Assessment/Plan   Last Updated: 18-Jul-13 15:26 by SAbbie Sons(MD)

## 2014-12-20 NOTE — Consult Note (Signed)
Chief Complaint:   Subjective/Chief Complaint Muscle invasive bladder cancer, bilateral hydronephrosis, dislodged nephrostomy tubes   VITAL SIGNS/ANCILLARY NOTES: *Intake and Output.:   Daily 27-Jul-13 07:00   Urine ml     Out:  50   Other Output ml     Out:  525   Other Output ml     Out:  0   Brief Assessment:   Cardiac Regular    Gastrointestinal Normal    Gastrointestinal details normal Soft  Nondistended  No masses palpable    Additional Physical Exam L nephrostomy tube in place.  R nephrostomy tube off - occlusive dressing saturated. L flank tenderness   Lab Results: Routine Chem:  12-Jul-13 23:27    Creatinine (comp)  5.34  14-Jul-13 07:58    Creatinine (comp)  4.49  15-Jul-13 13:23    Creatinine (comp)  4.40  16-Jul-13 04:18    Creatinine (comp)  4.45  18-Jul-13 11:43    Creatinine (comp)  7.55  19-Jul-13 07:47    Creatinine (comp)  5.59  20-Jul-13 06:13    Creatinine (comp)  3.96  21-Jul-13 03:21    Creatinine (comp)  3.12  22-Jul-13 07:05    Creatinine (comp)  2.34  23-Jul-13 06:22    Creatinine (comp)  2.39  24-Jul-13 06:59    Creatinine (comp)  2.14  25-Jul-13 04:46    Creatinine (comp)  2.05  27-Jul-13 04:56    Creatinine (comp)  2.93   Assessment/Plan:  Assessment/Plan:   Assessment Muscle invasive bladder cancer, bilateral hydronephrosis. Patient removed R nephrostomy tube, and L nephrostomy tube not in the renal pelvis.    Plan * Patient will need nephrostomy tubes replaced - and internalized if possible to ureteral stents. However, family needs to decide if they will allow that intervention.  I discussed this at length with the son this A.M. * The only chance for treating this bladder cancer and renal obstruction definitively is radical cystoprostatectomy and ileal conduit urinary diversion. The patient favors this, but the family seems reluctant.  He does have poor performance status. * In the event the family decides to pursue  intervention, he would need pulmonary function tests and cardiac clearance.   Electronic Signatures: Jeraldine Loots (MD)  (Signed 27-Jul-13 08:58)  Authored: Chief Complaint, VITAL SIGNS/ANCILLARY NOTES, Brief Assessment, Lab Results, Assessment/Plan   Last Updated: 27-Jul-13 08:58 by Jeraldine Loots (MD)

## 2014-12-20 NOTE — Consult Note (Signed)
History of Present Illness:   Reason for Consult Bladder cancer.    HPI   Patient is a 74 year old male with multiple medical problems who was initially admitted with bright red blood per rectum.  Colonoscopy was only revealed internal hemorrhoids.  Patient also carries a history of bladder cancer for approximately 2 years, but never sought followup after his diagnosis.  He is also status post bilateral nephrostomy tubes during this admission for acute renal failure caused by junction of his bladder cancer.  Currently, patient offers no complaints but review of systems is difficult since patient only gives one-word answers are sometimes refuses to answer questions.   PFSH:   Additional Past Medical and Surgical History Past medical history: Bladder cancer, hypertension, diabetes, hypercholesterolemia, COPD, GERD.  Past surgical history: Bilateral nephrostomy tubes.  Family history: Noncontributory.  Social history: Previous heavy tobacco one pack per day x60 years, history of heavy alcohol use but none for several years.   Review of Systems:   Performance Status (ECOG) 3    Review of Systems   Difficult to obtain, but as per HPI. Otherwise, 10 point system review was negative.   NURSING NOTES: **Vital Signs.:   23-Jul-13 04:21    Vital Signs Type: Routine    Temperature Temperature (F): 99.1    Celsius: 37.2    Temperature Source: Oral    Pulse Pulse: 77    Respirations Respirations: 20    Systolic BP Systolic BP: 465    Diastolic BP (mmHg) Diastolic BP (mmHg): 84    Mean BP: 105    Pulse Ox % Pulse Ox %: 94    Pulse Ox Activity Level: At rest    Oxygen Delivery: Room Air/ 21 %   Physical Exam:   Physical Exam General: Well-developed, well-nourished, no acute distress. Eyes: Pink conjunctiva, anicteric sclera. HEENT: Normocephalic, moist mucous membranes, clear oropharnyx. Lungs: Clear to auscultation bilaterally. Heart: Regular rate and rhythm. No rubs,  murmurs, or gallops. Abdomen: Soft, nontender, nondistended. No organomegaly noted, normoactive bowel sounds. Musculoskeletal: No edema, cyanosis, or clubbing. Neuro: Alert, confused. Cranial nerves grossly intact. Skin: No rashes or petechiae noted. Psych: Flat affect.    No Known Allergies:     Vitamin B6 100 mg oral tablet: 1 tab(s) orally once a day, Active, 0, None   Vitamin D3 5000 intl units oral capsule: 1 cap(s) orally once a day, Active, 0, None   omeprazole 40 mg oral delayed release capsule: 1 cap(s) orally once a day on empty stomach, Active, 0, None   Ventolin HFA: 2 puff(s) orally every 4 hours while awake for SOB, Active, 0, None   Spiriva 18 mcg inhalation capsule: 1 each inhaled once a day, Active, 0, None   Ziac 10 mg-6.25 mg oral tablet: 1 tab(s) orally once a day in the morning, Active, 0, None   glimepiride 2 mg oral tablet: 1 tab(s) orally once a day at breakfast, Active, 0, None   Celexa 20 mg oral tablet: 1 tab(s) orally once a day, Active, 0, None   Aricept 10 mg oral tablet: 1 tab(s) orally once a day (at bedtime), Active, 0, None   simvastatin 40 mg oral tablet: 1 tab(s) orally once a day (at bedtime), Active, 0, None   VESIcare 5 mg oral tablet: 1 tab(s) orally once a day, Active, 0, None   tamsulosin 0.4 mg oral capsule: 1 cap(s) orally once a day. 30 minutes after largest meal, Active, 0, None   finasteride 5 mg oral  tablet: 1 tab(s) orally once a day, Active, 0, None  Laboratory Results: Routine Chem:  23-Jul-13 06:22    Glucose, Serum  102   BUN  21   Creatinine (comp)  2.39   Sodium, Serum 141   Potassium, Serum 3.8   Chloride, Serum  108   CO2, Serum 23   Calcium (Total), Serum  8.4   Anion Gap 10   Osmolality (calc) 284   eGFR (African American)  31   eGFR (Non-African American)  26 (eGFR values <7m/min/1.73 m2 may be an indication of chronic kidney disease (CKD). Calculated eGFR is useful in patients with stable renal  function. The eGFR calculation will not be reliable in acutely ill patients when serum creatinine is changing rapidly. It is not useful in  patients on dialysis. The eGFR calculation may not be applicable to patients at the low and high extremes of body sizes, pregnant women, and vegetarians.)   Assessment and Plan:  Impression:   Bladder cancer.  Plan:   1.  Bladder cancer: Given patient's multiple medical problems and decreased performance status, treatment with chemotherapy would likely be more harmful than the cancer itself.  For the same reasons, surgery is likely not an option either.  Plan to get a PET scan later this afternoon to complete the staging workup.  This will also help with overall prognosis which at best is poor. consult, call with questions.  Electronic Signatures: FDelight Hoh(MD)  (Signed 23-Jul-13 10:31)  Authored: HISTORY OF PRESENT ILLNESS, PFSH, ROS, NURSING NOTES, PE, ALLERGIES, HOME MEDICATIONS, LABS, ASSESSMENT AND PLAN   Last Updated: 23-Jul-13 10:31 by FDelight Hoh(MD)

## 2014-12-20 NOTE — Consult Note (Signed)
Chief Complaint:   Subjective/Chief Complaint Alert, confused   VITAL SIGNS/ANCILLARY NOTES: **Vital Signs.:   22-Jul-13 04:43   Vital Signs Type Routine   Temperature Temperature (F) 98.2   Celsius 36.7   Temperature Source Oral   Pulse Pulse 79   Respirations Respirations 20   Systolic BP Systolic BP 060   Diastolic BP (mmHg) Diastolic BP (mmHg) 80   Mean BP 102   Pulse Ox % Pulse Ox % 93   Pulse Ox Activity Level  At rest   Oxygen Delivery Room Air/ 21 %   Brief Assessment:   Additional Physical Exam neph tube urine clear.  minimal Foley output   Lab Results: Routine Chem:  12-Jul-13 23:27    Creatinine (comp)  5.34  14-Jul-13 07:58    Creatinine (comp)  4.49  15-Jul-13 13:23    Creatinine (comp)  4.40  16-Jul-13 04:18    Creatinine (comp)  4.45  18-Jul-13 11:43    Creatinine (comp)  7.55  19-Jul-13 07:47    Creatinine (comp)  5.59  20-Jul-13 06:13    Creatinine (comp)  3.96  21-Jul-13 03:21    Creatinine (comp)  3.12  22-Jul-13 07:05    Creatinine (comp)  2.34   Assessment/Plan:  Assessment/Plan:   Assessment 1. Muscle invasive bladder CA 2. Bilat ureteral obstruction secondary #1 3. ARF secondary #2 s/p bilat nephrostomy    Plan 1. OK to d/c Foley 2. Family to discuss treatment desires.  Would need thorough metastatic eval prior to any consideration of    cystectomy   Electronic Signatures: Abbie Sons (MD)  (Signed 22-Jul-13 13:18)  Authored: Chief Complaint, VITAL SIGNS/ANCILLARY NOTES, Brief Assessment, Lab Results, Assessment/Plan   Last Updated: 22-Jul-13 13:18 by Abbie Sons (MD)

## 2014-12-20 NOTE — Consult Note (Signed)
Pt with renal failure and bilateral hydronephrosis most likely  secondary to ureteral obstruction.  He is for cysto and an attempt at ureteral stent placement  The procedure has been discussed with the pt and his son who is his poa. Based on clinical findings this is an urgent/ emergent case.  Electronic Signatures: Abbie Sons (MD)  (Signed on 17-Jul-13 09:11)  Authored  Last Updated: 17-Jul-13 09:11 by Abbie Sons (MD)

## 2014-12-20 NOTE — Consult Note (Signed)
Comments   I met with pt's son, Phil Michels, who is pt's HCPOA. Also present were pt's daughter, Freda Munro, and pt's brother and sister. I reviewed pt's medical condition in detail. They understand that pt is not a candidate for aggressive medical tx and they are in agreement with this. I suggested that, at this point, pt is most appropriate for SNF with hospice services. Pt's brother feels that pt would not want to have nephostomy tubes and asked that I discuss this with pt. In the presence of family, I attempted to have a discussion with pt concerning end of life care but I do not feel that pt clearly understands his situation. Family wants to discuss further with him.  discussed code status with family. They are split on their opinion on this (son and sister support DNR) and therefore want pt to remain a full code for now. spent: 90 minutes  Electronic Signatures: Ronte Parker, Izora Gala (MD)  (Signed 24-Jul-13 22:24)  Authored: Palliative Care   Last Updated: 24-Jul-13 22:24 by Chet Greenley, Izora Gala (MD)

## 2014-12-20 NOTE — H&P (Signed)
PATIENT NAME:  Micheal Conner, Micheal Conner MR#:  [id number] DATE OF BIRTH:  October 11, 1940  DATE OF ADMISSION:  07/20/2012  PRIMARY CARE PHYSICIAN: Juluis Pitch, MD   CHIEF COMPLAINT: Anemia.   HISTORY OF PRESENT ILLNESS: This is a 74 year old male patient with history of CKD, invasive bladder cancer, and recent diverticular bleed in July of 2013 along with diabetes and hypertension who is a resident at a nursing home who presented to the Emergency Room sent in after his labs showed anemia at 5.6 of hemoglobin. The patient has not had any melena or frank bleed. He does have CKD stage IV bladder cancer causing anemia of chronic disease. His last hemoglobin in August of 2013 was 8.2.   The patient does have some dementia with a poor historian but does not complain of any pain other than on and off cramping in his abdomen. No nausea, vomiting, diarrhea, shortness of breath, chest pain, palpitations, or syncope. He is not on any oxygen at the nursing home. The patient was DO NOT RESUSCITATE during his prior admission in July of 2013 but on reviewing the nursing home records the patient is presently a FULL CODE.   PAST MEDICAL HISTORY:  1. Invasive bladder cancer status post nephrostomy tubes which have been removed.  2. CKD, stage IV.  3. Hypertension.  4. Diabetes mellitus.  5. Benign prostatic hypertrophy.  6. Dementia.  7. Recurrent urinary tract infections.  8. Chronic hyponatremia.  9. 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  10. Diverticulosis with lower GI bleed in July 2013.   SOCIAL HISTORY: The patient is a resident of [facility] skilled nursing home. Does not smoke. No alcohol. No illicit drugs. Walks with a cane.   CODE STATUS: FULL CODE.   ALLERGIES: No known drug allergies.   FAMILY HISTORY: Reviewed and unknown.   HOME MEDICATIONS:  1. Iron 325 oral 2 times with meals.  2. Amlodipine 10 mg oral once a day.  3. Aricept 10 mg oral once a day.  4. Celexa 20 mg oral  once a day.  5. Fentanyl 25 mcg transdermal patch every three days.  6. Finasteride 5 mg oral once a day.  7. Flomax 0.4 mg oral once a day.  8. Hydralazine 25 mg oral 4 times a day.  9. Lopressor 50 mg oral 2 times a day.  10. Omeprazole 40 mg oral once a day.  11. Oxycodone 5 mg oral every four hours as needed for pain.  12. Simvastatin 40 mg oral once a day.  13. Spiriva 18 mcg inhaled once a day.  14. Tylenol 325 mg 2 capsules oral every four hours as needed for pain or fever.  15. Ventolin HFA 2 puffs inhaled every four hours.  16. Vitamin B6 1 tablet oral once a day.  17. Vitamin D3 5000 international units oral once a day.   REVIEW OF SYSTEMS: CONSTITUTIONAL: Complains of weakness and chronic pain in his abdomen. No weight loss, weight gain. EYES: No blurred vision, pain, redness. ENT: No tinnitus, ear pain. Does have some mild hearing loss. RESPIRATORY: No cough, wheeze, hemoptysis, dyspnea. CARDIOVASCULAR: No chest pain, orthopnea, edema. GI: No nausea, vomiting, diarrhea, abdominal pain. Has history of diverticular bleed and anemia. GENITOURINARY: Had nephrostomy tubes taken out. Has CKD. ENDOCRINE: No polyuria, nocturia, thyroid problems. HEMATOLOGIC/LYMPHATIC: Has anemia. No easy bruising or bleeding. INTEGUMENTARY: No rash, lesions. MUSCULOSKELETAL: Has pain in his knees with generalized muscle weakness. NEUROLOGIC: No focal numbness, weakness, dysarthria. Has dementia. PSYCHIATRIC: Has  anxiety. No depression.   PHYSICAL EXAMINATION:   VITAL SIGNS: Temperature 98.2, pulse 85, respirations 18, blood pressure 121/73, saturating 100% on room air.   GENERAL: Elderly, frail male patient lying in bed comfortable, conversational, cooperative with exam but confused.   PSYCHIATRIC: Alert and oriented to place but not time. Poor judgment.   HEENT: Atraumatic, normocephalic. Oral mucosa dry and pink. No oral ulcers or thrush. Pallor positive. No icterus. Pupils bilaterally equal and  reactive to light.   NECK: Supple. No thyromegaly. No palpable lymph nodes. Trachea midline. No carotid bruit, JVD.   CARDIOVASCULAR: S1, S2 regular rate and rhythm without any murmurs. Peripheral pulses 2+. No edema.   RESPIRATORY: Normal work of breathing. Clear to auscultation on both sides.   GI: Soft abdomen, nontender. Bowel sounds present. No hepatosplenomegaly palpable.   SKIN: Warm and dry, pale. No rash, ulcers.   GENITOURINARY: No CVA tenderness or bladder distention. No suprapubic tenderness. Has scarring from his prior nephrostomy tubes.   NEUROLOGICAL: Motor strength 5/5 in upper and lower extremities. Sensation to fine touch intact all over.   LYMPHATIC: No cervical lymphadenopathy.   LABORATORY, DIAGNOSTIC AND RADIOLOGIC DATA: Glucose 141, BUN 36, creatinine 2.47, sodium 137, potassium 4.7, GFR 25, calcium 8.4, albumin 2.4, alkaline phosphatase 251, WBC 12.1, hemoglobin 5.4, platelets 351. Urinalysis shows 2300 WBCs and 2+ bacteria. Recent urine cultures from September 2013 had Enterococcus sensitive to nitrofurantoin and repeat cultures a week later had mixed growth.   EKG shows normal sinus rhythm. No acute ST-T wave changes.     ASSESSMENT AND PLAN:  1. Acute on chronic anemia likely secondary from diverticular bleed. The patient's vitals look stable but definitely needs to be admitted to the hospital for further work-up and also transfusion of 2 units of blood. The patient is unstable considering severe anemia and possible further blood loss. This seems to be from diverticular bleed contributed by anemia of CKD and chronic disease. Will check iron studies prior to transfusion. Give 2 units of blood transfusion. Check stool occult. If this is positive, will consult GI.  2. Urinary tract infection. The patient does have WBCs and bacteria in the urine but no fever, no sepsis. Will start him on nitrofurantoin which Enterococcus from recent growth is sensitive to.   3. Invasive bladder cancer. The patient was last seen in the hospital in July 2013. Was supposed to follow-up with Oncology but at that time there was no treatment planned and cancer was thought to be advanced.  4. Diabetes mellitus. Sliding scale insulin, diabetic liquid diet if the patient needs possible colonoscopy at later time in the hospital.  5. Hypertension, well controlled. Continue medications.  6. COPD. Continue nebs and inhalers.  7. DVT prophylaxis. No heparin products secondary to possible GI bleed. SCDs.   CODE STATUS: FULL CODE.   TIME SPENT: Time spent was 55 minutes with more than 50% time spent in coordination of care.   I have tried to contact the patient's son, Orenthal Debski, at [phone] but had to leave a voicemail for the patient's son.   ____________________________ Leia Alf. Stephanos Fan, MD srs:drc D: 07/20/2012 20:32:44 ET T: 07/21/2012 05:59:08 ET JOB#: [id number]  cc: Alveta Heimlich R. Hermenia Fritcher, MD, <Dictator> Youlanda Roys. Lovie Macadamia, MD Neita Carp MD ELECTRONICALLY SIGNED 07/21/2012 16:52

## 2014-12-20 NOTE — Consult Note (Signed)
I spoke with the patient's son, Micheal Conner who is listed as his health care power of attorney.  He states he was diagnosed with bladder cancer in [place] approximately 2 years ago but has not been seen in over a year.  The CT findings were discussed.  Have recommended cystoscopy under anesthesia with possible TURBT and possible bilateral stent placement.  The procedure was discussed including the indications and potential risks of bleeding and infection.  The possible need for percutaneous nephrostomy tubes were also discussed.  All questions were answered to his satisfaction and he has agreed to proceed.  Electronic Signatures: Abbie Sons (MD)  (Signed on 16-Jul-13 18:46)  Authored  Last Updated: 16-Jul-13 18:46 by Abbie Sons (MD)

## 2014-12-20 NOTE — Discharge Summary (Signed)
PATIENT NAME:  Micheal Conner, Micheal Conner MR#:  [id number] DATE OF BIRTH:  1941-08-30  DATE OF ADMISSION:  03/14/2012 DATE OF DISCHARGE:  03/31/2012  This discharge summary covers the patient's hospital course from 07/27 to 03/31/2012. For  additional details, please see prior interim discharge summaries dictated by other physicians.   DIAGNOSES:  1. Acute renal failure from bilateral ureteral obstruction from invasive bladder cancer. 2. Invasive bladder cancer with ureteral involvement. 3. Hypokalemia, resolved.  4. Lower GI bleed with anemia, resolved. 5. Hypoglycemia with history of diabetes.  6. Hypertension.  7. Gastroesophageal reflux disease.  8. Benign prostatic hypertrophy.  9. Dementia.  10. Hyponatremia.  11. Leukocytosis.   DISPOSITION: The patient is being discharged to a rehab facility.   DIET: Low sodium, 1800-calorie ADA diet.   ACTIVITY: As tolerated.    FOLLOWUP:  Follow up with primary care physician one to two weeks after discharge.   DISCHARGE MEDICATIONS:  1. Vitamin B6 100 mg daily.  2. Vitamin D3 5,000 international units once a day.  3. Omeprazole 40 mg daily.  4. Ventolin HFA 2 puffs  q. 4 hours while awake for shortness of breath.  5. Spiriva 18 micrograms inhaled daily.  6. Celexa 20 mg daily.  7. Aricept 10 mg daily.  8. Simvastatin 40 mg daily.  9. Flomax 0.4 mg daily. 10. Finasteride 5 mg daily.  11. Tylenol 650 mg q. 4 hours p.r.n.  12. Oxycodone 5 mg q. 4 hours p.r.n.  13. Magnesium hydroxide 30 mL once a day as needed for constipation.  14. Lopressor 50 mg b.i.d.  15. Amlodipine 10 mg daily.  16. Ferrous sulfate 325 mg daily.  17. Hydralazine 25 mg q.i.d.   CONSULTATION:   1. Urology consultation with Dr. Bernardo Heater, Dr. Allyson Sabal. 2. Palliative care consultation with Dr. Ermalinda Memos.  3. GI consultation with Dr. Dionne Milo.  LABORATORY, DIAGNOSTIC, AND RADIOLOGICAL DATA: The patient underwent a CT-guided left-sided repeat nephrostomy tube placement on  03/30/2012.   Microbiology: Urine culture no growth so far. Hemoglobin stable at 8.4, white count 11.7, and platelets 201. Creatinine ranging from 2.93 to 3.58. Accu-Cheks controlled.   HOSPITAL COURSE: This discharge summary covers the patient's hospital course from July 27 to March 31, 2012. For additional details, please see the prior discharge summaries dictated by other physicians.  1. Acute renal failure from bilateral ureteral obstruction from invasive bladder cancer into the muscle. The patient had PET scan which was positive for bladder cancer with ureteral involvement. He has a history of bladder cancer in the past. His overall prognosis is poor. He is a poor candidate for chemotherapy or surgery. He was seen by oncologist Dr. Grayland Ormond and deemed a poor candidate for any kind of therapy.  2. Invasive bladder cancer with ureteral involvement.  After much discussion the family has decided on no surgery. The patient is now DO NOT RESUSCITATE. Once he finishes rehab he will be followed by surgery. He had two nephrostomy tubes placed on side which became dislodged. Initially the family was undecided but subsequently agreed to have repeat nephrostomy tubes placed.  Only one was able to be placed on the left side by the interventional radiologist on 03/30/2012. The patient's overall prognosis was very poor.  3. Hyperkalemia: It has resolved. The patient's potassium is currently normal. 4. History of lower GI bleed with anemia. The patient had possibly diverticular, has subsequently resolved. Colonoscopy showed diverticulosis, a single polyp and internal hemorrhoids. 5. Diabetes.  The patient was hypoglycemic on admission and  therefore his oral hypoglycemics were held. His Accu-Cheks are overall controlled on insulin sliding scale.  6. Accelerated hypertension on admission. The patient's blood pressure has been well controlled on current regimen.  7. Leukocytosis. Urinalysis and urine culture have been  negative. His leukocytosis has improved.  8. Hyponatremia, resolved.   The patient is being discharged to a rehab facility. Once he finishes rehab he will be followed by Hospice. He is DO NOT RESUSCITATE. The family does not want any aggressive treatment for his invasive bladder cancer. They understand his overall poor prognosis.   TIME SPENT: 45 minutes.    ____________________________ Cherre Huger, MD sp:bjt D: 03/31/2012 12:13:13 ET T: 03/31/2012 12:37:09 ET JOB#: [id number]  cc: Cherre Huger, MD, <Dictator> Cherre Huger MD ELECTRONICALLY SIGNED 04/01/2012 12:19

## 2014-12-20 NOTE — Consult Note (Signed)
Chief Complaint:   Subjective/Chief Complaint no complaints   VITAL SIGNS/ANCILLARY NOTES: **Vital Signs.:   16-Jul-13 04:39   Vital Signs Type Routine   Temperature Temperature (F) 98.3   Celsius 36.8   Temperature Source oral   Pulse Pulse 72   Respirations Respirations 18   Systolic BP Systolic BP 505   Diastolic BP (mmHg) Diastolic BP (mmHg) 78   Mean BP 108   Pulse Ox % Pulse Ox % 98   Pulse Ox Activity Level  At rest   Oxygen Delivery 3L   Brief Assessment:   Additional Physical Exam urine clear   Lab Results: Routine Chem:  12-Jul-13 23:27    Creatinine (comp)  5.34  14-Jul-13 07:58    Creatinine (comp)  4.49  15-Jul-13 13:23    Creatinine (comp)  4.40  16-Jul-13 04:18    Creatinine (comp)  4.45   Radiology Results: CT:    15-Jul-13 15:53, CT Abdomen and Pelvis Without Contrast   CT Abdomen and Pelvis Without Contrast    REASON FOR EXAM:    (1) renal failure and bladder mass; (2) renal failure   and bladder mass by sonogram  COMMENTS:       PROCEDURE: CT  - CT ABDOMEN AND PELVIS W0  - Mar 16 2012  3:53PM     RESULT: Noncontrast CT of the abdomen and pelvis is performed.   Correlation is made with the previous renal sonogram from 14 March 2012.    Previous ultrasound suggested urinary bladder mass. Bilateral   hydronephrosis was reported. There is right hydroureter and   hydronephrosis that is moderate in appearance with mild left-sided   hydroureter and hydronephrosis. There are small bilateral pleural   effusions with bibasilar areas of atelectasis versus infiltrate. The   cardiac silhouette is enlarged. A large portion of the stomach is     intrathoracic. Noabnormal bowel distention is evident. Atherosclerotic   disease is present. There is fatty infiltration of the pancreas without a   focal mass or ductal dilation. Noncontrast images of the liver and spleen   appear unremarkable. Bony structures show some mild degenerative change.   No ascites is  evident. No definite adenopathy is seen.    There is a fracture involving the L4 vertebral body that appears to be in   the coronal plane. The chronicity of this is uncertain.    IMPRESSION:   1. Bilateral hydronephrosis slightly worse on the right than the left.  2. Bilateral lower lobe atelectasis versus infiltrate with trace   effusions, worse on the left than the right.  3. A catheter is present. No retroperitoneal mass or adenopathy   appreciated.  4. Atherosclerotic disease.  5. L4 fracture of uncertain chronicity. No bony retropulsion causing   severe spinal canal stenosis.    Thank you for the opportunity to contribute to the care of your patient.     Dictation Site: 1          Verified By: Sundra Aland, M.D., MD   Assessment/Plan:  Assessment/Plan:   Assessment Impression: Bilateral hydronephrosis/hydroureter.  Noncontrast CT reviewed.  A definite bladder mass is not seen although this is a noncontrast study.  Creatinine is stable at 4.45.  Recommendation: He needs cystoscopy/bilateral retrograde pyelograms and if evidence of obstruction bilateral stent placement.  If there is a bladder mass it is possible he would need percutaneous nephrostomies.  If he is cleared for anesthesia I could perform tomorrow otherwise it could be also done  next week.  He apparently has a health care power of attorney who I would need to speak to as he is not aware of any bladder issues/problems.   Electronic Signatures: Abbie Sons (MD)  (Signed 16-Jul-13 12:43)  Authored: Chief Complaint, VITAL SIGNS/ANCILLARY NOTES, Brief Assessment, Lab Results, Radiology Results, Assessment/Plan   Last Updated: 16-Jul-13 12:43 by Abbie Sons (MD)

## 2014-12-25 NOTE — Consult Note (Signed)
PATIENT NAME:  Micheal Conner, Micheal Conner MR#:  [id number] DATE OF BIRTH:  02-15-1941  DATE OF CONSULTATION:  03/15/2012  REFERRING PHYSICIAN:   CONSULTING PHYSICIAN:  Jill Side, MD  PRIMARY CARE PHYSICIAN: Dr. Brunetta Genera   REASON FOR CONSULTATION: Rectal bleeding.   HISTORY OF PRESENT ILLNESS: 74 year old African American male who lives at an assisted living facility. Patient was admitted yesterday to Critical Care Unit after an episode of bright red blood per rectum with clots at the facility. According to patient he had no nausea, vomiting. He denies any abdominal pain. The bleeding was painless. He denies any similar episode in the past.   PAST MEDICAL HISTORY:  1. History of bladder cancer. The details are not available.  2. Hypertension. 3. Diabetes. 4. Hypercholesterolemia. 5. Gastroesophageal reflux disease.    SOCIAL HISTORY: He lives at an assisted living facility. He does not smoke or drink according to him but he used to be a heavy drinker. Has remote history of alcoholism.   MEDICATIONS:  1. Vitamin B6. 2. Vitamin D. 3. Omeprazole. 4. Spiriva. 5. ProAir. 6. Ziac.  7. Glimepiride. 8. Citalopram. 9. Aricept. 10. Lovastatin.  11. Finasteride.    ALLERGIES: None.   PHYSICAL EXAMINATION:  GENERAL: Fairly well built male, does not appear to be in any acute distress, somewhat lethargic but otherwise arousable and oriented.   VITAL SIGNS: Vitals are fairly stable on admission. Heart rate is in 60s, blood pressure 130/86, respirations 18, and he has been afebrile.   LUNGS: Grossly clear to auscultation bilaterally with fair air entry and no added sounds.   CARDIOVASCULAR: Regular rate and rhythm.   ABDOMEN: Slightly distended and mildly tympanitic on percussion. There is no rebound or guarding. No significant abdominal tenderness was noted.   NEUROLOGIC: Appears to be grossly nonfocal.   LABORATORY, DIAGNOSTIC AND RADIOLOGICAL DATA: Hemoglobin was 9.5 which slowly  dropped down to 7.1 as of yesterday. He received a unit of blood transfusion and his most recent hemoglobin is 9.1. White cell count 8.9, platelet count 171. Electrolytes: BUN 41, creatinine 4.49. Liver enzymes are unremarkable. PT and INR within normal limits.   ASSESSMENT AND PLAN: Patient with hematochezia which is painless. Most likely we are dealing with a case of diverticular bleeding. Clinically the bleeding seems to have resolved. Patient did drop his hemoglobin and hematocrit significantly although he does appear to be chronically anemic as well. His hemoglobin and hematocrit seems to be stable now. Colonoscopy was suggested admitted with which he agreed. A colonoscopy was done this morning which showed extensive diverticulosis. No blood or bleeding was noted. A small sigmoid polyp was noted as well which was removed. Patient also has internal hemorrhoids.   IMPRESSION: Painless hematochezia most likely secondary to diverticulosis. There are no signs of active GI bleeding and he is hemodynamically stable with stable hemoglobin and hematocrit. Also internal hemorrhoids and a colon polyp which was removed. Questionable history of bladder cancer. Urology is on consultation.   RECOMMENDATIONS: Follow hemoglobin and hematocrit. Start full liquid diet and advance gradually. Will follow up on the results of the pathology from his colon polyps. Patient may be transferred out of Critical Care Unit. No further recommendations from the GI standpoint. Will sign off. Please do not hesitate to call us if there are any signs of further active GI bleeding.   ____________________________ Jill Side, MD si:cms D: 03/15/2012 09:52:29 ET T: 03/15/2012 11:39:54 ET JOB#: [id number]  cc: Jill Side, MD, <Dictator> Meindert A. Brunetta Genera, MD  [facility] Dionne Milo  MD ELECTRONICALLY SIGNED 04/03/2012 13:23

## 2014-12-25 NOTE — H&P (Signed)
PATIENT NAME:  Micheal Conner, Micheal Conner MR#:  [id number] DATE OF BIRTH:  September 19, 1940  DATE OF ADMISSION:  03/14/2012  PRIMARY CARE PHYSICIAN: Dr. Brunetta Genera    CHIEF COMPLAINT: Rectal bleeding.   HISTORY OF PRESENT ILLNESS: Micheal Conner is a 74 year old African American male resident of family care home assisted facility was brought here to the hospital for evaluation of bright red blood per rectum associated with clots of blood started last evening. He denies having any abdominal pain. No vomiting. No diarrhea. No fever. No chills. The patient does not provide much of history about his past medical illness. He has formal paper from the assisted facility that has the diagnosis of bladder cancer along with hypertension and chronic back pain, gastroesophageal reflux disease and elevated PSA. The patient himself does not know how the diagnosis of bladder cancer came. He does not recall any procedure of cystoscopy or any biopsy was taken.   REVIEW OF SYSTEMS: This was difficult as the patient is not in a good mood to give any information. He stated that he already spoke with the other doctor. CONSTITUTIONAL: Denies any fever. No chills. No fatigue. EYES: No blurring of vision. No double vision. ENT: No hearing impairment. No sore throat. No dysphagia. CARDIOVASCULAR: No chest pain. No shortness of breath. No syncope. RESPIRATORY: No shortness of breath. No cough. No chest pain. GASTROINTESTINAL: No abdominal pain. No vomiting. No melena but reported the rectal bleeding. GENITOURINARY: He has gross hematuria for a while. No frequency of urination. MUSCULOSKELETAL: No joint pain or swelling. No muscular pain or swelling. INTEGUMENTARY: No skin rash. No ulcers. NEUROLOGY: No focal weakness. No seizure activity. No headache. PSYCHIATRY: He has history of anxiety and depression. He looks in depressed mood. ENDOCRINE: No heat or cold intolerance. No polyuria or polydipsia.   PAST MEDICAL HISTORY:  1. Reported bladder cancer.  Again, I did not get any history from the patient if he had any cystoscopy or biopsy done. I spoke with his assisted facility home care in particular the caregiver and she stated that is the only available information and they do not have much of records. He had been seeing Dr. Brunetta Genera for at least last eight months. He has not been hospitalized here since and we do not have any old records for him.  2. Hypertension. 3. Diabetes mellitus. 4. Hypercholesterolemia.  5. From what I get from his medications that he has chronic obstructive pulmonary disease. 6. Hypercholesterolemia.  7. Bladder outlet obstruction.  8. History of gastroesophageal reflux disease.   FAMILY HISTORY: I could not get much of family history from him.   SOCIAL HISTORY: He is separated. Lives at assisted health facility. He is retired. Used to deliver newspapers.  SOCIAL HABITS: He used to be heavy smoker, 1 pack per day since age of 47, he cut down to three cigarettes a day. Patient has remote history of alcoholism, but he has stopped years ago. He used to drink mix of beer and liquor and sometimes wine.   CURRENT MEDICATIONS: 1. Vitamin B6, 100 mg a day. 2. Vitamin D 5000 units once a day.  3. Omeprazole 40 mg a day. 4. ProAir HFA inhaler 2 puffs every four hours while awake p.r.n.  5. Spiriva 1 inhalation once a day. 6. Ziac 10/6.25 mg once a day.  7. Glimepiride 2 mg a day.  8. Citalopram 20 mg a day. 9. Aricept 10 mg a day.  10. Lovastatin 40 mg a day.  11. VESIcare 5 mg a  day. 12. Tamsulosin 0.4 mg once a day. 13. Finasteride 5 mg a day.   ALLERGIES: No known drug allergies.   PHYSICAL EXAMINATION:  VITAL SIGNS: Blood pressure 138/74, respiratory rate 20, pulse 66, temperature 98.8, oxygen saturation 98%.   GENERAL APPEARANCE: Elderly male laying in bed in no acute distress.   HEAD AND NECK EXAMINATION: No pallor. No icterus. No cyanosis.   EARS, NOSE AND THROAT: Hearing was normal. Nasal mucosa,  lips, tongue were normal.   EYES: Normal eyelids and conjunctiva. Pupils about pinpointed, difficult to elicit any reactivity to light.   NECK: Supple. Trachea at midline. No thyromegaly. No cervical lymphadenopathy. No masses.   HEART: Normal S1, S2. No S3 or S4. No murmur. No gallop. No carotid bruits.   RESPIRATORY: Normal breathing pattern without use of accessory muscles. No rales. No wheezing.   ABDOMEN: Soft without tenderness. No hepatosplenomegaly. No masses. No hernias.   SKIN: No ulcers. No subcutaneous nodules.   MUSCULOSKELETAL: No joint swelling. No clubbing.   NEUROLOGIC: Cranial nerves II through XII are intact. No focal motor deficit.   PSYCHIATRY: Patient is alert, oriented to place and people. Mood and affect were flat.   LABORATORY, DIAGNOSTIC, AND RADIOLOGICAL DATA: EKG showed normal sinus rhythm at rate of 67 per minute. Atrial premature complexes. Poor progression of R waves in the anterior chest leads, otherwise unremarkable EKG. Serum glucose 63, BUN 47, creatinine 5.3, sodium 144, potassium 4.3, serum CO2 low at 20, anion gap was normal at 11. Estimated GFR 12. Normal liver function tests except for low albumin at 3.3. Troponin 0.04. CBC showed white count 8000, hemoglobin 9.5, hematocrit 29, platelet count 171. Prothrombin time 13. INR 1. APTT 31. Urinalysis showed cloudy urine with 52,000 red blood cells, 116 white blood cells, +3 bacteria.   ASSESSMENT:  1. Active rectal bleeding x24 hours associated with clots of blood. 2. Renal failure, unsure at this point whether this is acute renal failure or chronic renal failure. From his brief records there is no mention of renal failure in the past.  3. Gross hematuria with diagnosis of bladder cancer per his records.  4. Normochromic normocytic anemia secondary to anemia from chronic disease and also secondary to acute blood loss.  5. Chronic obstructive pulmonary disease.  6. Systemic hypertension.  7. Diabetes  mellitus, type 2, now he is hypoglycemic. 8. Bladder outlet obstruction likely secondary to benign prostatic hypertrophy.    PLAN: Will admit the patient to the Intensive Care Unit since the nurse now reports to me that he keeps having the rectal bleeding. Will monitor hemoglobin and hematocrit q.8 hours. Blood for type and cross match and hold 1 unit of packed red blood cells. GI consultation. Ultrasound of the kidneys to ensure there is no obstructive uropathy. Accu-Cheks to follow up on the hypoglycemia and insulin sliding scale as needed. I will hold his oral hypoglycemic agent in the face of advancing renal failure. Repeat basic metabolic profile tomorrow to follow up on the creatinine. I spoke with the patient regarding LIVING WILL. He said he does not have a LIVING WILL and does not give specific reason but he had appointed his son, Richardson Landry, to have the power of attorney.   TIME SPENT EVALUATING THIS PATIENT: Took more than one hour.    ____________________________ Clovis Pu. Lenore Manner, MD amd:cms D: 03/14/2012 02:20:24 ET T: 03/14/2012 08:22:47 ET JOB#: [id number]  cc: Clovis Pu. Lenore Manner, MD, <Dictator> Meindert A. Brunetta Genera, MD  Mike Craze Irven Coe MD ELECTRONICALLY SIGNED  03/14/2012 22:22 

## 2014-12-25 NOTE — Consult Note (Signed)
Chief Complaint:   Subjective/Chief Complaint Colonoscopy showed extensive diverticulosis throughout the colon. No blood or bleeding. A sigmoid polyp was seen and removed. Internal hemorrhoids.  Impression: Diverticular bleed, resolved. Hemorrhoids. Sigmoid polyp, removed.  Recommendations: Advance diet. Will follow pathology resukts. May be transferred out of CCU. Follow H and H. No further recommendations. Will sign off. Please call me if needed. Thanks.   Electronic Signatures: Jill Side (MD)  (Signed 14-Jul-13 09:45)  Authored: Chief Complaint   Last Updated: 14-Jul-13 09:45 by Jill Side (MD)

## 2014-12-25 NOTE — Consult Note (Signed)
PATIENT NAME:  Micheal Conner, Micheal Conner MR#:  [id number] DATE OF BIRTH:  February 26, 1941  DATE OF CONSULTATION:  03/14/2012  REFERRING PHYSICIAN:  Dr. Vianne Bulls  CONSULTING PHYSICIAN:  Juliann Mule. Valma Cava II, MD  REASON FOR CONSULTATION: Bilateral hydronephrosis and hematuria.  HISTORY OF PRESENT ILLNESS: Micheal Conner is a 74 year old African American male resident of a skilled nursing facility or assisted home care facility is brought to the hospital with bright red blood per rectum, is being evaluated for rectal bleeding which is according to GI notes concerning for diverticular in nature and he has been scheduled for colonoscopy tomorrow. He denied any abdominal pain, nausea, vomiting, or fever or chills prior to coming in to the hospital. He is not provide much history. History is garnered through limited notes from the nursing home and his son and his brother who are at the bedside. In his records from the facility he has a diagnosis of bladder cancer along with hypertension, reflux, and elevated PSA. When conversing with his son his bladder cancer diagnosis was a little over two years ago, he is not currently seeing a urologist in regards to this. Upon admission he had a Foley catheter placed and initially had some gross hematuria which has now cleared. It is unclear how much urine came out when placed. Upon query his son said he does not ever tell us when he voids, he just voids into a diaper.    REVIEW OF SYSTEMS: Patient gave a very limited review of systems but the balance of 11 systems otherwise negative other than outlined above.   PAST MEDICAL HISTORY:  1. Bladder cancer, unknown treatment or duration.  2. Hypertension.  3. Diabetes.  4. Hypercholesterolemia.  5. Chronic obstructive pulmonary disease.  6. Hypercholesterolemia.  7. Bladder obstruction.  8. Gastric reflux.   FAMILY HISTORY: Noncontributory.   SOCIAL HISTORY: Lives in assisted facility. Used to be a heavy smoker and has a reported  history of alcoholism.   OUTPATIENT MEDICATIONS: Up to date and reviewed in depth and correct in chart includes tamsulosin and finasteride.   ALLERGIES: No known drug allergies.   PHYSICAL EXAM:  GENERAL: He is alert and oriented, not much conversationalist.   VITAL SIGNS: Temperature afebrile at 37.1, heart rate 60, blood pressure 143/85, sating 100% on 3 liters nasal cannula.   HEENT: Normocephalic, atraumatic.   NECK: Supple without any lymphadenopathy.   HEART: Regular rate and rhythm.   RESPIRATORY: Normal breathing pattern. Nonlabored.  CARDIOVASCULAR: Regular rate and rhythm. 2+ upper and lower extremity pulses.   ABDOMINAL: Soft, nontender, nondistended.   GENITOURINARY: Normal external male genitalia with a Foley catheter draining clear urine currently.  RECTAL: Deferred at this time.   MUSCULOSKELETAL: 5/5 strength.  SKIN: No ulcers.   NEUROLOGICAL: Appeared grossly intact.   PSYCH: Flat affect.   LABORATORY, DIAGNOSTIC AND RADIOLOGICAL DATA: Creatinine 5.3, estimated GFR 12. He has no prior for comparison. Ultrasound of the kidneys were reviewed, shows bilateral hydronephrosis , questionable bladder mass.   ASSESSMENT: 74 year old male here for rectal bleeding and plan for colonoscopy tomorrow with incidentally noted acute versus chronic renal failure and bilateral hydronephrosis and a questionable bladder tumor in the setting of a long-standing diagnosis of bladder cancer. His most acute issue of course is the bright red blood per rectum and he is undergoing management from GI in regards to this. In regards to his bilateral hydro, this could either come from chronic bladder outlet obstruction as patient voids and unable to tell when he  goes per the son and suspect the bilateral nature of the hydro that this likely may be the case. Foley catheter was just placed earlier today and it would be reasonable given his other issues to monitor his creatinine and urine output  over the next 48 to 72 hours and consider reimaging him. Will consider a noncontrasted CT scan this time to dilate the bladder mass as well. This also gives time for the other issues to resolve. If his creatinine worsens or fails to resolve with a Foley catheter then patient would be a candidate for possible cystoscopy, bilateral stent placement versus bilateral percutaneous nephrostomy tubes depending on family and patient's wishes and his overall health status in the coming days. This plan was discussed in detail with the primary care physician, the patient and patient's family; all are in agreement and will monitor and follow.  ____________________________ Juliann Mule Nobie Putnam, MD erh:cms D: 03/14/2012 14:40:29 ET T: 03/14/2012 14:55:22 ET JOB#: [id number]  cc: Juliann Mule. Yogesh Cominsky, II, MD, <Dictator>  Report entered as incorrect report type; entered as an history and physical and should be a consultation.  Carroll Sage MD ELECTRONICALLY SIGNED 03/14/2012 20:27

## 2014-12-25 NOTE — Consult Note (Signed)
Brief Consult Note: Diagnosis: Hematochezia.   Patient was seen by consultant.   Discussed with Attending MD.   Comments: Hematochezia, most likely diverticular. Resolved. Hemodynamically stable. Acute on chronic anemia. Bladder mass. COPD and other comorbid conditions.  Recommendations: Follow H and H and transfuse if needed. Clear liqiud diet. Colonoscopy in am. If signs of significant active bleeding, obtain stat bleeding scan.  Electronic Signatures: Jill Side (MD)  (Signed 13-Jul-13 11:58)  Authored: Brief Consult Note   Last Updated: 13-Jul-13 11:58 by Jill Side (MD)

## 2015-02-28 ENCOUNTER — Encounter: Payer: Self-pay | Admitting: Gastroenterology

## 2015-03-24 ENCOUNTER — Encounter: Payer: Self-pay | Admitting: Gastroenterology

## 2015-03-27 ENCOUNTER — Encounter: Payer: Self-pay | Admitting: Internal Medicine

## 2015-03-27 ENCOUNTER — Encounter: Payer: Self-pay | Admitting: Gastroenterology
